# Patient Record
Sex: Female | Born: 1970 | Race: White | Hispanic: No | Marital: Married | Smoking: Former smoker
Health system: Southern US, Community
[De-identification: ages and names within clinical notes are randomized; demographics above are authoritative.]

## PROBLEM LIST (undated history)

## (undated) DIAGNOSIS — E7800 Pure hypercholesterolemia, unspecified: Secondary | ICD-10-CM

## (undated) DIAGNOSIS — N809 Endometriosis, unspecified: Secondary | ICD-10-CM

## (undated) DIAGNOSIS — G43909 Migraine, unspecified, not intractable, without status migrainosus: Secondary | ICD-10-CM

## (undated) DIAGNOSIS — R569 Unspecified convulsions: Secondary | ICD-10-CM

## (undated) DIAGNOSIS — D219 Benign neoplasm of connective and other soft tissue, unspecified: Secondary | ICD-10-CM

## (undated) DIAGNOSIS — F17200 Nicotine dependence, unspecified, uncomplicated: Secondary | ICD-10-CM

## (undated) DIAGNOSIS — F32A Depression, unspecified: Secondary | ICD-10-CM

## (undated) DIAGNOSIS — F329 Major depressive disorder, single episode, unspecified: Secondary | ICD-10-CM

## (undated) HISTORY — DX: Benign neoplasm of connective and other soft tissue, unspecified: D21.9

## (undated) HISTORY — PX: FOOT SURGERY: SHX648

## (undated) HISTORY — DX: Nicotine dependence, unspecified, uncomplicated: F17.200

## (undated) HISTORY — DX: Pure hypercholesterolemia, unspecified: E78.00

## (undated) HISTORY — DX: Endometriosis, unspecified: N80.9

## (undated) HISTORY — DX: Migraine, unspecified, not intractable, without status migrainosus: G43.909

## (undated) HISTORY — PX: TUBAL LIGATION: SHX77

## (undated) HISTORY — DX: Depression, unspecified: F32.A

## (undated) HISTORY — PX: OOPHORECTOMY: SHX86

## (undated) HISTORY — DX: Major depressive disorder, single episode, unspecified: F32.9

---

## 1997-09-25 ENCOUNTER — Other Ambulatory Visit: Admission: RE | Admit: 1997-09-25 | Discharge: 1997-09-25 | Payer: Self-pay | Admitting: Obstetrics & Gynecology

## 1997-11-18 ENCOUNTER — Ambulatory Visit (HOSPITAL_COMMUNITY): Admission: RE | Admit: 1997-11-18 | Discharge: 1997-11-18 | Payer: Self-pay | Admitting: Obstetrics & Gynecology

## 1998-01-07 ENCOUNTER — Other Ambulatory Visit: Admission: RE | Admit: 1998-01-07 | Discharge: 1998-01-07 | Payer: Self-pay | Admitting: *Deleted

## 1998-03-31 ENCOUNTER — Inpatient Hospital Stay (HOSPITAL_COMMUNITY): Admission: AD | Admit: 1998-03-31 | Discharge: 1998-04-03 | Payer: Self-pay | Admitting: Obstetrics and Gynecology

## 2006-06-25 ENCOUNTER — Ambulatory Visit: Payer: Self-pay | Admitting: Family Medicine

## 2006-07-02 ENCOUNTER — Ambulatory Visit: Payer: Self-pay | Admitting: Family Medicine

## 2006-07-11 ENCOUNTER — Ambulatory Visit: Payer: Self-pay | Admitting: Family Medicine

## 2006-08-13 ENCOUNTER — Ambulatory Visit: Payer: Self-pay | Admitting: Family Medicine

## 2006-10-25 ENCOUNTER — Ambulatory Visit: Payer: Self-pay | Admitting: Family Medicine

## 2009-06-17 ENCOUNTER — Ambulatory Visit: Payer: Self-pay | Admitting: Family Medicine

## 2009-07-22 ENCOUNTER — Ambulatory Visit: Payer: Self-pay | Admitting: Family Medicine

## 2010-07-30 ENCOUNTER — Encounter: Payer: Self-pay | Admitting: Family Medicine

## 2011-09-07 ENCOUNTER — Encounter: Payer: Self-pay | Admitting: Family Medicine

## 2011-09-07 ENCOUNTER — Ambulatory Visit (INDEPENDENT_AMBULATORY_CARE_PROVIDER_SITE_OTHER): Payer: 59 | Admitting: Family Medicine

## 2011-09-07 ENCOUNTER — Encounter: Payer: Self-pay | Admitting: Internal Medicine

## 2011-09-07 VITALS — BP 122/80 | HR 87 | Wt 270.0 lb

## 2011-09-07 DIAGNOSIS — M79672 Pain in left foot: Secondary | ICD-10-CM

## 2011-09-07 DIAGNOSIS — M79609 Pain in unspecified limb: Secondary | ICD-10-CM

## 2011-09-07 DIAGNOSIS — N920 Excessive and frequent menstruation with regular cycle: Secondary | ICD-10-CM

## 2011-09-07 LAB — CBC WITH DIFFERENTIAL/PLATELET
Basophils Absolute: 0 10*3/uL (ref 0.0–0.1)
Basophils Relative: 0 % (ref 0–1)
Eosinophils Absolute: 0.2 10*3/uL (ref 0.0–0.7)
Eosinophils Relative: 3 % (ref 0–5)
HCT: 38.9 % (ref 36.0–46.0)
Hemoglobin: 13.3 g/dL (ref 12.0–15.0)
Lymphocytes Relative: 32 % (ref 12–46)
Lymphs Abs: 2.9 10*3/uL (ref 0.7–4.0)
MCH: 30.9 pg (ref 26.0–34.0)
MCHC: 34.2 g/dL (ref 30.0–36.0)
MCV: 90.3 fL (ref 78.0–100.0)
Monocytes Absolute: 0.5 10*3/uL (ref 0.1–1.0)
Monocytes Relative: 5 % (ref 3–12)
Neutro Abs: 5.3 10*3/uL (ref 1.7–7.7)
Neutrophils Relative %: 59 % (ref 43–77)
Platelets: 352 10*3/uL (ref 150–400)
RBC: 4.31 MIL/uL (ref 3.87–5.11)
RDW: 13.1 % (ref 11.5–15.5)
WBC: 8.9 10*3/uL (ref 4.0–10.5)

## 2011-09-07 MED ORDER — MEDROXYPROGESTERONE ACETATE 10 MG PO TABS: 10.0000 mg | ORAL_TABLET | Freq: Every day | ORAL | Status: DC

## 2011-09-07 MED ORDER — TRAMADOL HCL 50 MG PO TABS: 50.0000 mg | ORAL_TABLET | Freq: Three times a day (TID) | ORAL | Status: AC | PRN

## 2011-09-07 NOTE — Patient Instructions (Signed)
Let me know the name of the orthopedic surgeon so we can make the referral.

## 2011-09-07 NOTE — Progress Notes (Signed)
  Subjective:    Patient ID: Jacqueline Davis, female    DOB: 1971/06/30, 41 y.o.   MRN: [id number]  HPI She complains of difficulty with her menstrual cycle. She had the start of a cycle about 2 days earlier than normal which lasted for 2 days. She then went 3 days without any bleeding and started bleeding again for 4 days. I've days later she started again and has now been flowing for 12 days. She has been having increasing difficulty with cramping and with her bleeding. Prior to this her cycles have been regular. She has had a BTL in 1999. She also complains of pain and swelling in her left foot. She has a previous history of a crush injury and subsequent orthopedic followup and a midfoot fusion.   Review of Systems     Objective:   Physical Exam Exam of the left foot does show decreased motion of the ankle and forefoot. She does have some slight swelling over the dorsum of the foot. Pelvic exam shows a small amount of blood. Difficult to do the pelvic due to her abdominal adiposity.       Assessment & Plan:   1. Menorrhagia  CBC with Differential  2. Left foot pain     she does not remember the name of the orthopedic surgeon that she saw. Recommend she get me his name and I will refer her back to him. We'll also give her Provera to help with her bleeding and if no improvement possible referral will be made. Tramadol as written. Cautioned her to use it sparingly.

## 2011-09-08 NOTE — Progress Notes (Signed)
Quick Note:  The blood work is normal ______ 

## 2011-09-12 ENCOUNTER — Telehealth: Payer: Self-pay | Admitting: Family Medicine

## 2011-09-12 NOTE — Telephone Encounter (Signed)
Needs GYN referral for continued difficulty with menorrhagia

## 2011-09-12 NOTE — Telephone Encounter (Signed)
Please call patient. She states you told her that her period may get worse before it gets better. Her question is how much worse?  Bleeding heavier than she was. Yesterday used 8-10 tampons and pads in half of a day.  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

## 2011-09-12 NOTE — Telephone Encounter (Signed)
Needs gyn referral

## 2011-09-14 NOTE — Telephone Encounter (Signed)
Pt will make appt!

## 2011-09-15 ENCOUNTER — Encounter (INDEPENDENT_AMBULATORY_CARE_PROVIDER_SITE_OTHER): Payer: 59 | Admitting: Obstetrics and Gynecology

## 2011-09-15 DIAGNOSIS — N920 Excessive and frequent menstruation with regular cycle: Secondary | ICD-10-CM

## 2011-09-15 DIAGNOSIS — E663 Overweight: Secondary | ICD-10-CM

## 2011-09-28 ENCOUNTER — Telehealth: Payer: Self-pay | Admitting: Family Medicine

## 2011-09-28 NOTE — Telephone Encounter (Signed)
DONE

## 2014-07-10 HISTORY — PX: CHOLECYSTECTOMY, LAPAROSCOPIC: SHX56

## 2015-04-15 ENCOUNTER — Other Ambulatory Visit: Payer: Self-pay

## 2015-04-15 DIAGNOSIS — Z1231 Encounter for screening mammogram for malignant neoplasm of breast: Secondary | ICD-10-CM

## 2015-04-19 ENCOUNTER — Ambulatory Visit
Admission: RE | Admit: 2015-04-19 | Discharge: 2015-04-19 | Disposition: A | Discharge status: Home / Self Care | Payer: 59 | Source: Ambulatory Visit

## 2015-04-19 DIAGNOSIS — Z1231 Encounter for screening mammogram for malignant neoplasm of breast: Secondary | ICD-10-CM

## 2015-07-20 ENCOUNTER — Other Ambulatory Visit: Payer: Self-pay | Admitting: Gynecology

## 2015-07-20 ENCOUNTER — Ambulatory Visit (INDEPENDENT_AMBULATORY_CARE_PROVIDER_SITE_OTHER): Payer: 59 | Admitting: Gynecology

## 2015-07-20 ENCOUNTER — Encounter: Payer: Self-pay | Admitting: Gynecology

## 2015-07-20 ENCOUNTER — Ambulatory Visit (INDEPENDENT_AMBULATORY_CARE_PROVIDER_SITE_OTHER): Payer: 59

## 2015-07-20 VITALS — BP 122/74 | Ht 62.5 in | Wt 209.0 lb

## 2015-07-20 DIAGNOSIS — G44019 Episodic cluster headache, not intractable: Secondary | ICD-10-CM | POA: Diagnosis not present

## 2015-07-20 DIAGNOSIS — N926 Irregular menstruation, unspecified: Secondary | ICD-10-CM

## 2015-07-20 DIAGNOSIS — D251 Intramural leiomyoma of uterus: Secondary | ICD-10-CM

## 2015-07-20 DIAGNOSIS — D259 Leiomyoma of uterus, unspecified: Secondary | ICD-10-CM | POA: Diagnosis not present

## 2015-07-20 DIAGNOSIS — N83201 Unspecified ovarian cyst, right side: Secondary | ICD-10-CM | POA: Diagnosis not present

## 2015-07-20 DIAGNOSIS — Z86718 Personal history of other venous thrombosis and embolism: Secondary | ICD-10-CM

## 2015-07-20 NOTE — Patient Instructions (Addendum)
Uterine Fibroids Uterine fibroids are tissue masses (tumors) that can develop in the womb (uterus). They are also called leiomyomas. This type of tumor is not cancerous (benign) and does not spread to other parts of the body outside of the pelvic area, which is between the hip bones. Occasionally, fibroids may develop in the fallopian tubes, in the cervix, or on the support structures (ligaments) that surround the uterus. You can have one or many fibroids. Fibroids can vary in size, weight, and where they grow in the uterus. Some can become quite large. Most fibroids do not require medical treatment. CAUSES A fibroid can develop when a single uterine cell keeps growing (replicating). Most cells in the human body have a control mechanism that keeps them from replicating without control. SIGNS AND SYMPTOMS Symptoms may include:   Heavy bleeding during your period.  Bleeding or spotting between periods.  Pelvic pain and pressure.  Bladder problems, such as needing to urinate more often (urinary frequency) or urgently.  Inability to reproduce offspring (infertility).  Miscarriages. DIAGNOSIS Uterine fibroids are diagnosed through a physical exam. Your health care provider may feel the lumpy tumors during a pelvic exam. Ultrasonography and an MRI may be done to determine the size, location, and number of fibroids. TREATMENT Treatment may include:  Watchful waiting. This involves getting the fibroid checked by your health care provider to see if it grows or shrinks. Follow your health care provider's recommendations for how often to have this checked.  Hormone medicines. These can be taken by mouth or given through an intrauterine device (IUD).  Surgery.  Removing the fibroids (myomectomy) or the uterus (hysterectomy).  Removing blood supply to the fibroids (uterine artery embolization). If fibroids interfere with your fertility and you want to become pregnant, your health care provider  may recommend having the fibroids removed.  HOME CARE INSTRUCTIONS  Keep all follow-up visits as directed by your health care provider. This is important.  Take medicines only as directed by your health care provider.  If you were prescribed a hormone treatment, take the hormone medicines exactly as directed.  Do not take aspirin, because it can cause bleeding.  Ask your health care provider about taking iron pills and increasing the amount of dark green, leafy vegetables in your diet. These actions can help to boost your blood iron levels, which may be affected by heavy menstrual bleeding.  Pay close attention to your period and tell your health care provider about any changes, such as:  Increased blood flow that requires you to use more pads or tampons than usual per month.  A change in the number of days that your period lasts per month.  A change in symptoms that are associated with your period, such as abdominal cramping or back pain. SEEK MEDICAL CARE IF:  You have pelvic pain, back pain, or abdominal cramps that cannot be controlled with medicines.  You have an increase in bleeding between and during periods.  You soak tampons or pads in a half hour or less.  You feel lightheaded, extra tired, or weak. SEEK IMMEDIATE MEDICAL CARE IF:  You faint.  You have a sudden increase in pelvic pain.   This information is not intended to replace advice given to you by your health care provider. Make sure you discuss any questions you have with your health care provider.   Document Released: 06/23/2000 Document Revised: 07/17/2014 Document Reviewed: 12/23/2013 Elsevier Interactive Patient Education 2016 [employer]  Total Laparoscopic Hysterectomy A total laparoscopic  hysterectomy is a minimally invasive surgery to remove your uterus and cervix. This surgery is performed by making several small cuts (incisions) in your abdomen. It can also be done with a thin, lighted tube  (laparoscope) inserted into two small incisions in your lower abdomen. Your fallopian tubes and ovaries can be removed (bilateral salpingo-oophorectomy) during this surgery as well.Benefits of minimally invasive surgery include:  Less pain.  Less risk of blood loss.  Less risk of infection.  Quicker return to normal activities. LET [facility] CARE PROVIDER KNOW ABOUT:  Any allergies you have.  All medicines you are taking, including vitamins, herbs, eye drops, creams, and over-the-counter medicines.  Previous problems you or members of your family have had with the use of anesthetics.  Any blood disorders you have.  Previous surgeries you have had.  Medical conditions you have. RISKS AND COMPLICATIONS  Generally, this is a safe procedure. However, as with any procedure, complications can occur. Possible complications include:  Bleeding.  Blood clots in the legs or lung.  Infection.  Injury to surrounding organs.  Problems with anesthesia.  Early menopause symptoms (hot flashes, night sweats, insomnia).  Risk of conversion to an open abdominal incision. BEFORE THE PROCEDURE  Ask your health care provider about changing or stopping your regular medicines.  Do not take aspirin or blood thinners (anticoagulants) for 1 week before the surgery or as told by your health care provider.  Do not eat or drink anything for 8 hours before the surgery or as told by your health care provider.  Quit smoking if you smoke.  Arrange for a ride home after surgery and for someone to help you at home during recovery. PROCEDURE   You will be given antibiotic medicine.  An IV tube will be placed in your arm. You will be given medicine to make you sleep (general anesthetic).  A gas (carbon dioxide) will be used to inflate your abdomen. This will allow your surgeon to look inside your abdomen, perform your surgery, and treat any other problems found if necessary.  Three or four small  incisions (often less than 1/2 inch) will be made in your abdomen. One of these incisions will be made in the area of your belly button (navel). The laparoscope will be inserted into the incision. Your surgeon will look through the laparoscope while doing your procedure.  Other surgical instruments will be inserted through the other incisions.  Your uterus may be removed through your vagina or cut into small pieces and removed through the small incisions.  Your incisions will be closed. AFTER THE PROCEDURE  The gas will be released from inside your abdomen.  You will be taken to the recovery area where a nurse will watch and check your progress. Once you are awake, stable, and taking fluids well, without other problems, you will return to your room or be allowed to go home.  There is usually minimal discomfort following the surgery because the incisions are so small.  You will be given pain medicine while you are in the hospital and for when you go home.   This information is not intended to replace advice given to you by your health care provider. Make sure you discuss any questions you have with your health care provider.   Document Released: 04/23/2007 Document Revised: 02/26/2013 Document Reviewed: 01/14/2013 Elsevier Interactive Patient Education [employer]. Endometriosis Endometriosis is a condition in which the tissue that lines the uterus (endometrium) grows outside of its normal location.  The tissue may grow in many locations close to the uterus, but it commonly grows on the ovaries, fallopian tubes, vagina, or bowel. Because the uterus expels, or sheds, its lining every menstrual cycle, there is bleeding wherever the endometrial tissue is located. This can cause pain because blood is irritating to tissues not normally exposed to it.  CAUSES  The cause of endometriosis is not known.  SIGNS AND SYMPTOMS  Often, there are no symptoms. When symptoms are present, they can vary  with the location of the displaced tissue. Various symptoms can occur at different times. Although symptoms occur mainly during a woman's menstrual period, they can also occur midcycle and usually stop with menopause. Some people may go months with no symptoms at all. Symptoms may include:   Back or abdominal pain.   Heavier bleeding during periods.   Pain during intercourse.   Painful bowel movements.   Infertility. DIAGNOSIS  Your health care provider will do a physical exam and ask about your symptoms. Various tests may be done, such as:   Blood tests and urine tests. These are done to help rule out other problems.   Ultrasound. This test is done to look for abnormal tissue.   An X-ray of the lower bowel (barium enema).  Laparoscopy. In this procedure, a thin, lighted tube with a tiny camera on the end (laparoscope) is inserted into your abdomen. This helps your health care provider look for abnormal tissue to confirm the diagnosis. The health care provider may also remove a small piece of tissue (biopsy) from any abnormal tissue found. This tissue sample can then be sent to a lab so it can be looked at under a microscope. TREATMENT  Treatment will vary and may include:   Medicines to relieve pain. Nonsteroidal anti-inflammatory drugs (NSAIDs) are a type of pain medicine that can help to relieve the pain caused by endometriosis.  Hormonal therapy. When using hormonal therapy, periods are eliminated. This eliminates the monthly exposure to blood by the displaced endometrial tissue.   Surgery. Surgery may sometimes be done to remove the abnormal endometrial tissue. In severe cases, surgery may be done to remove the fallopian tubes, uterus, and ovaries (hysterectomy). HOME CARE INSTRUCTIONS   Take all medicines as directed by your health care provider. Do not take aspirin because it may increase bleeding when you are not on hormonal therapy.   Avoid activities that produce  pain, including sexual activity. SEEK MEDICAL CARE IF:  You have pelvic pain before, after, or during your periods.  You have pelvic pain between periods that gets worse during your period.  You have pelvic pain during or after sex.  You have pelvic pain with bowel movements or urination, especially during your period.  You have problems getting pregnant.  You have a fever. SEEK IMMEDIATE MEDICAL CARE IF:   Your pain is severe and is not responding to pain medicine.   You have severe nausea and vomiting, or you cannot keep foods down.   You have pain that is limited to the right lower part of your abdomen.   You have swelling or increasing pain in your abdomen.   You see blood in your stool.  MAKE SURE YOU:   Understand these instructions.  Will watch your condition.  Will get help right away if you are not doing well or get worse.   This information is not intended to replace advice given to you by your health care provider. Make sure you discuss any  questions you have with your health care provider.   Document Released: 06/23/2000 Document Revised: 07/17/2014 Document Reviewed: 02/21/2013 Elsevier Interactive Patient Education [employer].

## 2015-07-20 NOTE — Progress Notes (Signed)
   Patient is a 45 year old gravida 2 para 2 (prior cesarean section/tubal ligation) who presented to the office today referred by her PCP Bronwen Betters as a result of a fibroid uterus and noted at time of evaluation last year yes CT scan as a result of patient's abdominal pains which turned out to be cholelithiasis. She stated that she had her cholecystectomy in November 2017 and is here for follow-up as a result of the incidental finding of the fibroid on CT scan. She reports menstrual cycles the last 5-6 days but sometimes they occur every 6-8 weeks. She denies any nipple discharge some occasional visual disturbances and recently some headaches. Her PCP has done her blood work recently to include CBC and TSH a few weeks ago to which patient stated was normal.  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 She is sexually active is not complaining of any dyspareunia. 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 The first episode was postop after for surgery in 2006 and then once again in 2012. She is currently on 81 mg of baby aspirin daily. She denies any easy bruisability.  CT scan done November 19 had demonstrated evidence of multiple sub-Center gallstones within the gallbladder and also a 4.3 cm posterior upper uterine body mass likely representing a fibroid. No adnexal masses were reported the rest of the CT scan was otherwise normal.  Exam: Gen. appearance: Well developed well nourished female with the above mentioned findings on CT scan but no acute distress Abdomen: Soft nontender no rebound or guarding Pelvic: Bartholin urethra Skene glands within normal limits Vagina: Slight pinkish discharge patient currently on telemetry no menstrual cycle Cervix: No lesions or discharge Uterus: Upper limits of normal 5 irregularity at the fundus of the uterus posterior questionable fibroid Adnexa: No palpable masses or tenderness Rectal exam:  Not done  Ultrasound today: Uterus measured 8.9 x 7.1 x 6.4 cm with endometrial stripe of 1.5 mm. Uterus was posterior fundal intramural fibroid measured 5.8 x 3.6 x 3.7 cm. Thin echogenic endometrium. Right ovary thick wall corpus luteum cyst measuring 22 x 11 x 16 mm with positive color flow the periphery. Left ovary not seen. Negative left adnexa no apparent adnexal masses. Some fluid was noted the cul-de-sac 27 x 7 mm.  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 Patient cholecystectomy last year. Patient now is brought to my attention that time she has dyspareunia. Especially during deep thrust. Because of her irregular menstrual cycle history we are going to check a prolactin level as well as an [place]. Her PCP did her TSH and the rest of her blood work recently. Patient with past history of DVT 2 on baby aspirin. Patient's sister and daughter have had endometriosis. I'm going to provide her with literature and information on endometriosis as well as on fibroid uterus. She will maintain a menstrual calendar for the next 6 months. She'll return back to the office 66 months for follow-up ultrasound at that time we'll reassess in review her symptoms as well. If it is affecting her quality of life and we discussed and at that time hysterectomy.

## 2015-07-21 LAB — PROLACTIN: Prolactin: 6.3 ng/mL

## 2015-07-21 LAB — FOLLICLE STIMULATING HORMONE: FSH: 47.8 m[IU]/mL

## 2015-08-17 ENCOUNTER — Ambulatory Visit (INDEPENDENT_AMBULATORY_CARE_PROVIDER_SITE_OTHER): Payer: 59 | Admitting: Neurology

## 2015-08-17 ENCOUNTER — Encounter: Payer: Self-pay | Admitting: Neurology

## 2015-08-17 VITALS — BP 145/95 | HR 63 | Ht 62.5 in | Wt 213.8 lb

## 2015-08-17 DIAGNOSIS — R2689 Other abnormalities of gait and mobility: Secondary | ICD-10-CM

## 2015-08-17 DIAGNOSIS — R251 Tremor, unspecified: Secondary | ICD-10-CM | POA: Diagnosis not present

## 2015-08-17 DIAGNOSIS — G43909 Migraine, unspecified, not intractable, without status migrainosus: Secondary | ICD-10-CM | POA: Insufficient documentation

## 2015-08-17 DIAGNOSIS — H532 Diplopia: Secondary | ICD-10-CM | POA: Diagnosis not present

## 2015-08-17 DIAGNOSIS — R269 Unspecified abnormalities of gait and mobility: Secondary | ICD-10-CM | POA: Diagnosis not present

## 2015-08-17 NOTE — Progress Notes (Signed)
[facility]    Provider:  Dr Jaynee Eagles Referring Provider: Delia Chimes, NP Primary Care Physician:  Delia Chimes, NP  CC:  tremor  HPI:  Jacqueline Davis is a 45 y.o. female here as a referral from Dr. Toy Cookey for tremor. PMHx hld, migraines, depression, anxiety, obesity. She noticed tremor last summer. The tremor is off and on. More the right arm. She is right handed. She has some blurred vision at baseline but had some episodes of double vision (one on top of the other and slightly skewed), she has some visual acuity difficulty and needs prisms in her lenses. She has had 20 minutes x 3 episodes of diplopia that happened since last summer. It has happened in the morning, not associated with time of day or with fatigue. The tremor is when she is just sitting there the thumb does it a lot harder to thread needles for example. Not severe, not affecting lfe significantly, its annoying. It happens daily, variable amounts of time, can be action or rest, increased with anxiety her whole body shakes. She does not drink caffeine, decaf and half caf only. She started taking prozac last September and hasn't affected the tremor.  No tremor in the family as far as she knows. She is off balance and falls sometimes. She holds the railing, she is off balance, not dizzy. She fell backwards in the dining room as she was stepping to close the door. She takes Klonopin and husband thinks it was that. Shee denies dizziness. She has fallen on the ground 6 times. She has other complints such as memory loss, neck pain, joint pain and stress at home, LBP, neck pain, left hip and foot injuries.  Reviewed notes, labs and imaging from outside physicians, which showed:  Reviewed notes from [place] [facility]. Patient reporting anorexia, ahedonia , nausea vomiting with most foods, admits to being mostly down and sad but occasionally has lots of energy without increased spending but is very talkative and  active and decreased sleep. She reports her family does not know what to do with her when she becomes like this which is not very often. Exam was normal. Mental status exam was normal and patient's mood and affect were described as normal. She was diagnosed with bipolar depression, anhedonia and started on Seroquel.  Review of Systems: Patient complains of symptoms per HPI as well as the following symptoms: blurred vision, fatigue, double vision, loss of vision, eye pain, easy bruising, feeling cold, hearing loss, joint paoin, aching muscles, memory loss, headche, weakness, difficulty swallowing, dizziness, tremor, depression, anxiety, not enough sleep, dec energy. Pertinent negatives per HPI. All others negative.   Social History   Social History  . Marital Status: Married    Spouse Name: Jeral Fruit  . Number of Children: 2  . Years of Education: 14   Occupational History  . [employer]    Social History Main Topics  . Smoking status: Former Smoker -- 1.00 packs/day    Types: Cigarettes    Quit date: 07/19/2009  . Smokeless tobacco: Never Used  . Alcohol Use: 0.0 oz/week    0 Standard drinks or equivalent per week     Comment: OCC  . Drug Use: No  . Sexual Activity: Yes     Comment: TUBAL LIGATION    Other Topics Concern  . Not on file   Social History Narrative   Lives with husband and daughter   Caffeine use: 1/2-1 cup coffee per day    Family  History  Problem Relation Age of Onset  . Diabetes Mother   . Hypertension Mother   . Fibromyalgia Mother   . Cancer Sister     UTERINE  . Diabetes Paternal Grandfather   . Endometriosis Daughter   . Endometriosis Sister   . Neuropathy Neg Hx     Past Medical History  Diagnosis Date  . Smoker   . High cholesterol   . Depression   . Migraine   . Endometriosis   . Fibroid tumor     Uterus    Past Surgical History  Procedure Laterality Date  . Cholecystectomy, laparoscopic  2016  . Cesarean section  1990/1999     X2  . Oophorectomy Right   . Tubal ligation    . Foot surgery  2005/2006    X2    Current Outpatient Prescriptions  Medication Sig Dispense Refill  . aspirin EC 81 MG tablet Take 81 mg by mouth daily.    Marland Kitchen atenolol (TENORMIN) 25 MG tablet Take by mouth daily.    Marland Kitchen atorvastatin (LIPITOR) 20 MG tablet Take 20 mg by mouth daily.    . calcium-vitamin D 250-100 MG-UNIT tablet Take 1 tablet by mouth 2 (two) times daily.    . cholecalciferol (VITAMIN D) 1000 units tablet Take 1,000 Units by mouth daily.    . clonazePAM (KLONOPIN) 0.5 MG tablet Take 0.5 mg by mouth 2 (two) times daily as needed for anxiety.    Marland Kitchen FLUoxetine (PROZAC) 40 MG capsule Take 40 mg by mouth daily.    . fluticasone (FLONASE) 50 MCG/ACT nasal spray Place into both nostrils daily.    Marland Kitchen gabapentin (NEURONTIN) 100 MG capsule Take 100 mg by mouth 3 (three) times daily.    . methocarbamol (ROBAXIN) 500 MG tablet Take 500 mg by mouth 4 (four) times daily.    . Multiple Vitamin (MULTIVITAMIN) tablet Take 1 tablet by mouth daily.    Marland Kitchen omeprazole (PRILOSEC) 20 MG capsule Take 20 mg by mouth daily.    . QUEtiapine (SEROQUEL) 100 MG tablet Take 100 mg by mouth at bedtime.    . ranitidine (ZANTAC) 150 MG tablet Take 150 mg by mouth 2 (two) times daily.    . medroxyPROGESTERone (PROVERA) 10 MG tablet Take 1 tablet (10 mg total) by mouth daily. 7 tablet 0   No current facility-administered medications for this visit.    Allergies as of 08/17/2015  . (No Known Allergies)    Vitals: BP 145/95 mmHg  Pulse 63  Ht 5' 2.5" (1.588 m)  Wt 213 lb 12.8 oz (96.979 kg)  BMI 38.46 kg/m2  SpO2 97%  LMP 07/09/2015 Last Weight:  Wt Readings from Last 1 Encounters:  08/17/15 213 lb 12.8 oz (96.979 kg)   Last Height:   Ht Readings from Last 1 Encounters:  08/17/15 5' 2.5" (1.588 m)   Physical exam: Exam: Gen: NAD, conversant, well nourised, obese, well groomed                     CV: RRR, no MRG. No Carotid Bruits. No peripheral  edema, warm, nontender Eyes: Conjunctivae clear without exudates or hemorrhage  Neuro: Detailed Neurologic Exam  Speech:    Speech is normal; fluent and spontaneous with normal comprehension.  Cognition:    The patient is oriented to person, place, and time;     recent and remote memory intact;     language fluent;     normal attention, concentration,  fund of knowledge Cranial Nerves:    The pupils are equal, round, and reactive to light. The fundi are without edema. Visual fields are full to finger confrontation. Extraocular movements are intact. Trigeminal sensation is intact and the muscles of mastication are normal. The face is symmetric. The palate elevates in the midline. Hearing intact. Voice is normal. Shoulder shrug is normal. The tongue has normal motion without fasciculations.   Coordination:    Normal finger to nose and heel to shin.  Gait:    Not ataxic  Motor Observation:    Very mild high-frequency low amplitude postural tremor. Tone:    Normal muscle tone.    Posture:    Posture is normal. normal erect    Strength: left df weakness due to previous injury, mild hip flexion weakness    Otherwise trength is V/V in the upper and lower limbs.      Sensation: intact to LT, pp, temp, prop and vibration     Reflex Exam:  DTR's:    Deep tendon reflexes in the upper and lower extremities are normal bilaterally.   Toes:    The toes are equiv bilaterally.   Clonus:    Clonus is absent.       Assessment/Plan:  This is a 45 year old female with a past medical history of bipolar depression, left hip and foot injuries here for evaluation of tremor, falls, and other multiple complaints such as 3 episodes brief diplopia. Neuro exam unremarkable. May be essential vs physiologic tremor.   Tremor, falls, diplopia: Will order Mri brain and cervical cord Falls - likely multifactorial, previous hip and foot injuries with weakness, needs physical therapy for gait and  safety assessment and use of possible walking aids. MRI brain and cervical cord. Recommend minimizing all meds as much as possible that can cause sedation and risk of falls.  Labs today including tsh, bmp, heavy metals  Cc: susan fuller  Sarina Ill, MD  [facility] [address] [place], [place] [address]  Phone [phone] Fax [phone]

## 2015-08-17 NOTE — Patient Instructions (Addendum)
Remember to drink plenty of fluid, eat healthy meals and do not skip any meals. Try to eat protein with a every meal and eat a healthy snack such as fruit or nuts in between meals. Try to keep a regular sleep-wake schedule and try to exercise daily, particularly in the form of walking, 20-30 minutes a day, if you can.   As far as diagnostic testing: MRI of the brain and cervical cord, labs, physical therapy  I would like to see you back in 4 months, sooner if we need to. Please call us with any interim questions, concerns, problems, updates or refill requests.   Our phone number is [phone]. We also have an after hours call service for urgent matters and there is a physician on-call for urgent questions. For any emergencies you know to call 911 or go to the nearest emergency room

## 2015-08-19 ENCOUNTER — Telehealth: Payer: Self-pay | Admitting: *Deleted

## 2015-08-19 LAB — BASIC METABOLIC PANEL
BUN/Creatinine Ratio: 10 (ref 9–23)
BUN: 6 mg/dL (ref 6–24)
CHLORIDE: 105 mmol/L (ref 96–106)
CO2: 21 mmol/L (ref 18–29)
Calcium: 8.8 mg/dL (ref 8.7–10.2)
Creatinine, Ser: 0.58 mg/dL (ref 0.57–1.00)
GFR calc Af Amer: 130 mL/min/{1.73_m2} (ref >59)
GFR calc non Af Amer: 112 mL/min/{1.73_m2} (ref >59)
Glucose: 90 mg/dL (ref 65–99)
POTASSIUM: 4.4 mmol/L (ref 3.5–5.2)
SODIUM: 141 mmol/L (ref 134–144)

## 2015-08-19 LAB — THYROID PANEL WITH TSH
FREE THYROXINE INDEX: 1.8 (ref 1.2–4.9)
T3 Uptake Ratio: 25 % (ref 24–39)
T4 TOTAL: 7.1 ug/dL (ref 4.5–12.0)
TSH: 2.1 u[IU]/mL (ref 0.450–4.500)

## 2015-08-19 LAB — HEAVY METALS, BLOOD
Arsenic: 6 ug/L (ref 2–23)
Lead, Blood: NOT DETECTED ug/dL (ref 0–19)
Mercury: NOT DETECTED ug/L (ref 0.0–14.9)

## 2015-08-19 NOTE — Telephone Encounter (Signed)
Called and spoke to pt about normal labs per Dr Ahern. Pt verbalized understanding.  

## 2015-08-19 NOTE — Telephone Encounter (Signed)
-----   Message from Melvenia Beam, MD sent at 08/19/2015  7:27 AM EST ----- Labs normal thanks

## 2015-08-22 ENCOUNTER — Encounter: Payer: Self-pay | Admitting: Neurology

## 2015-09-08 ENCOUNTER — Ambulatory Visit (INDEPENDENT_AMBULATORY_CARE_PROVIDER_SITE_OTHER): Payer: 59

## 2015-09-08 DIAGNOSIS — R251 Tremor, unspecified: Secondary | ICD-10-CM | POA: Diagnosis not present

## 2015-09-08 DIAGNOSIS — R2689 Other abnormalities of gait and mobility: Secondary | ICD-10-CM | POA: Diagnosis not present

## 2015-09-08 DIAGNOSIS — H532 Diplopia: Secondary | ICD-10-CM

## 2015-09-08 MED ORDER — GADOPENTETATE DIMEGLUMINE 469.01 MG/ML IV SOLN: 20.0000 mL | Freq: Once | INTRAVENOUS | Status: AC | PRN

## 2015-09-10 ENCOUNTER — Telehealth: Payer: Self-pay | Admitting: Neurology

## 2015-09-10 NOTE — Telephone Encounter (Signed)
Jacqueline Davis, would you call patient and ask her to make an appointment to review the MRI of the brain and cervical cord please? Please reassure her there is no cause for alarm, nothing acute or serious seen but it is hard to talk about everything over the phone and I like to show people pictures. thanks

## 2015-09-13 NOTE — Telephone Encounter (Signed)
Called pt and relayed message. Gave [facility] phone number to call back and schedule f/u with Dr Jaynee Eagles. PLEASE schedule if she calls back.

## 2015-09-13 NOTE — Telephone Encounter (Signed)
Pt returned call. Appt made for March 23rd

## 2015-09-13 NOTE — Telephone Encounter (Signed)
FYI

## 2015-09-14 NOTE — Telephone Encounter (Signed)
Called and spoke to pt. Advised up to her on whether she would like her husband to come or not. Relayed Dr Jaynee Eagles message again. Told her to check in 15 min prior to appt time. She verbalized understanding.

## 2015-09-14 NOTE — Telephone Encounter (Signed)
Patient called to inquire if husband needs to be with her at appointment on 09/30/15 to discuss MRI results.

## 2015-09-30 ENCOUNTER — Ambulatory Visit (INDEPENDENT_AMBULATORY_CARE_PROVIDER_SITE_OTHER): Payer: 59 | Admitting: Neurology

## 2015-09-30 ENCOUNTER — Encounter: Payer: Self-pay | Admitting: Neurology

## 2015-09-30 VITALS — BP 111/69 | HR 67 | Ht 62.5 in | Wt 225.0 lb

## 2015-09-30 DIAGNOSIS — R251 Tremor, unspecified: Secondary | ICD-10-CM

## 2015-09-30 DIAGNOSIS — R29898 Other symptoms and signs involving the musculoskeletal system: Secondary | ICD-10-CM

## 2015-09-30 DIAGNOSIS — W19XXXD Unspecified fall, subsequent encounter: Secondary | ICD-10-CM

## 2015-09-30 DIAGNOSIS — H532 Diplopia: Secondary | ICD-10-CM | POA: Diagnosis not present
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
## 2015-10-02 DIAGNOSIS — R29898 Other symptoms and signs involving the musculoskeletal system: Secondary | ICD-10-CM | POA: Insufficient documentation

## 2016-01-17 ENCOUNTER — Encounter: Payer: Self-pay | Admitting: Gynecology

## 2016-01-17 ENCOUNTER — Ambulatory Visit (INDEPENDENT_AMBULATORY_CARE_PROVIDER_SITE_OTHER): Payer: 59

## 2016-01-17 ENCOUNTER — Ambulatory Visit (INDEPENDENT_AMBULATORY_CARE_PROVIDER_SITE_OTHER): Payer: 59 | Admitting: Gynecology

## 2016-01-17 VITALS — BP 122/86

## 2016-01-17 DIAGNOSIS — D251 Intramural leiomyoma of uterus: Secondary | ICD-10-CM | POA: Diagnosis not present

## 2016-01-17 DIAGNOSIS — N946 Dysmenorrhea, unspecified: Secondary | ICD-10-CM

## 2016-01-17 DIAGNOSIS — D259 Leiomyoma of uterus, unspecified: Secondary | ICD-10-CM | POA: Diagnosis not present

## 2016-01-17 DIAGNOSIS — Z86718 Personal history of other venous thrombosis and embolism: Secondary | ICD-10-CM

## 2016-01-17 DIAGNOSIS — N920 Excessive and frequent menstruation with regular cycle: Secondary | ICD-10-CM

## 2016-01-17 NOTE — Patient Instructions (Signed)
Levonorgestrel intrauterine device (IUD) What is this medicine? LEVONORGESTREL IUD (LEE voe nor jes trel) is a contraceptive (birth control) device. The device is placed inside the uterus by a healthcare professional. It is used to prevent pregnancy and can also be used to treat heavy bleeding that occurs during your period. Depending on the device, it can be used for 3 to 5 years. This medicine may be used for other purposes; ask your health care provider or pharmacist if you have questions. What should I tell my health care provider before I take this medicine? They need to know if you have any of these conditions: -abnormal Pap smear -cancer of the breast, uterus, or cervix -diabetes -endometritis -genital or pelvic infection now or in the past -have more than one sexual partner or your partner has more than one partner -heart disease -history of an ectopic or tubal pregnancy -immune system problems -IUD in place -liver disease or tumor -problems with blood clots or take blood-thinners -use intravenous drugs -uterus of unusual shape -vaginal bleeding that has not been explained -an unusual or allergic reaction to levonorgestrel, other hormones, silicone, or polyethylene, medicines, foods, dyes, or preservatives -pregnant or trying to get pregnant -breast-feeding How should I use this medicine? This device is placed inside the uterus by a health care professional. Talk to your pediatrician regarding the use of this medicine in children. Special care may be needed. Overdosage: If you think you have taken too much of this medicine contact a poison control center or emergency room at once. NOTE: This medicine is only for you. Do not share this medicine with others. What if I miss a dose? This does not apply. What may interact with this medicine? Do not take this medicine with any of the following medications: -amprenavir -bosentan -fosamprenavir This medicine may also interact with  the following medications: -aprepitant -barbiturate medicines for inducing sleep or treating seizures -bexarotene -griseofulvin -medicines to treat seizures like carbamazepine, ethotoin, felbamate, oxcarbazepine, phenytoin, topiramate -modafinil -pioglitazone -rifabutin -rifampin -rifapentine -some medicines to treat HIV infection like atazanavir, indinavir, lopinavir, nelfinavir, tipranavir, ritonavir -St. John's wort -warfarin This list may not describe all possible interactions. Give your health care provider a list of all the medicines, herbs, non-prescription drugs, or dietary supplements you use. Also tell them if you smoke, drink alcohol, or use illegal drugs. Some items may interact with your medicine. What should I watch for while using this medicine? Visit your doctor or health care professional for regular check ups. See your doctor if you or your partner has sexual contact with others, becomes HIV positive, or gets a sexual transmitted disease. This product does not protect you against HIV infection (AIDS) or other sexually transmitted diseases. You can check the placement of the IUD yourself by reaching up to the top of your vagina with clean fingers to feel the threads. Do not pull on the threads. It is a good habit to check placement after each menstrual period. Call your doctor right away if you feel more of the IUD than just the threads or if you cannot feel the threads at all. The IUD may come out by itself. You may become pregnant if the device comes out. If you notice that the IUD has come out use a backup birth control method like condoms and call your health care provider. Using tampons will not change the position of the IUD and are okay to use during your period. What side effects may I notice from receiving this medicine?   Side effects that you should report to your doctor or health care professional as soon as possible: -allergic reactions like skin rash, itching or  hives, swelling of the face, lips, or tongue -fever, flu-like symptoms -genital sores -high blood pressure -no menstrual period for 6 weeks during use -pain, swelling, warmth in the leg -pelvic pain or tenderness -severe or sudden headache -signs of pregnancy -stomach cramping -sudden shortness of breath -trouble with balance, talking, or walking -unusual vaginal bleeding, discharge -yellowing of the eyes or skin Side effects that usually do not require medical attention (report to your doctor or health care professional if they continue or are bothersome): -acne -breast pain -change in sex drive or performance -changes in weight -cramping, dizziness, or faintness while the device is being inserted -headache -irregular menstrual bleeding within first 3 to 6 months of use -nausea This list may not describe all possible side effects. Call your doctor for medical advice about side effects. You may report side effects to FDA at 1-800-FDA-1088. Where should I keep my medicine? This does not apply. NOTE: This sheet is a summary. It may not cover all possible information. If you have questions about this medicine, talk to your doctor, pharmacist, or health care provider.    2016, Elsevier/Gold Standard. (2011-07-27 13:54:04)  

## 2016-01-17 NOTE — Progress Notes (Signed)
   Patient is a 45 year old was seen in the office on January 10 as a referral from her PCP [facility] 4 as a result of a fibroid uterus. Patient last year was being evaluated for abdominal pain and it turned out she had cholelithiasis and had a cholecystectomy in November 2017. During that time a CT scan had demonstrated incidentally she had a fibroid measuring 4.3 cm posterior uterine fundus. No adnexal masses were reported.  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 She is sexually active is not complaining of any dyspareunia. 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 The first episode was postop after for surgery in 2006 and then once again in 2012. She is currently on 81 mg of baby aspirin daily. She denies any easy bruisability. She has informed me that her father had a PE and her brother had DVT also. She has not seen a hematologist or has had any further workup.  We did an ultrasound here in the office in January of this year and the following was reported: Uterus measured 8.9 x 7.1 x 6.4 cm with endometrial stripe of 1.5 mm. Uterus was posterior fundal intramural fibroid measured 5.8 x 3.6 x 3.7 cm. Thin echogenic endometrium. Right ovary thick wall corpus luteum cyst measuring 22 x 11 x 16 mm with positive color flow the periphery. Left ovary not seen. Negative left adnexa no apparent adnexal masses. Some fluid was noted the cul-de-sac 27 x 7 mm.  Her follow-up ultrasound today: Uterus measured 9.2 x 8.7 x 5.87 m with endometrial stripe of 3.5 mm. Intramural fibroid measuring 53 x 43 x 62 mm a small right corpus luteum cyst with positive color flow to the periphery measuring 12 x 9 mm. Absent left adnexa. No fluid in the cul-de-sac.  During that office visit she had an [facility] as well as a prolactin level. [place] slightly in the menopausal range prolactin was normal and PCP and recently done a TSH.    Assessment/plan: Stable fibroid  uterus. Patient with 2 previous DVTs currently on baby aspirin. I've recommended for her dysmenorrhea and menorrhagia to consider Mirena IUD. The risks benefits and pros and cons were discussed. She fully understands that all forms of hormone replacement therapy including IUDs were placed on the same category with black box warning a potential risk for blood clots. Very rare with a Mirena IUD. I have recommended consideration of this case invasive procedure to control her symptoms rather than subject her major surgery for hysterectomy with a past history of 2 DVTs. Literature information was provided.  Greater than 50% of the time was spent in counseling according care for this patient. Time of consultation 10 minutes

## 2016-01-18 ENCOUNTER — Telehealth: Payer: Self-pay | Admitting: Gynecology

## 2016-01-18 NOTE — Telephone Encounter (Signed)
01/18/16-I LM VM for patient to let her know that her [employer] ins will cover the Mirena for med dx: N92.0 per JF with her $35 copay and that it needs to be inserted while on cycle if she decides to proceed. Per [email or web address]

## 2016-04-04 ENCOUNTER — Ambulatory Visit: Payer: 59 | Admitting: Neurology

## 2016-04-04 ENCOUNTER — Telehealth: Payer: Self-pay | Admitting: *Deleted

## 2016-04-04 NOTE — Telephone Encounter (Signed)
no showed f/u 

## 2016-04-10 ENCOUNTER — Encounter: Payer: Self-pay | Admitting: Neurology

## 2016-04-19 ENCOUNTER — Telehealth: Payer: Self-pay | Admitting: Neurology

## 2016-04-19 ENCOUNTER — Encounter: Payer: Self-pay | Admitting: Gynecology

## 2016-04-19 ENCOUNTER — Ambulatory Visit (INDEPENDENT_AMBULATORY_CARE_PROVIDER_SITE_OTHER): Payer: 59 | Admitting: Gynecology

## 2016-04-19 ENCOUNTER — Telehealth: Payer: Self-pay

## 2016-04-19 VITALS — BP 136/88

## 2016-04-19 DIAGNOSIS — N926 Irregular menstruation, unspecified: Secondary | ICD-10-CM

## 2016-04-19 DIAGNOSIS — D251 Intramural leiomyoma of uterus: Secondary | ICD-10-CM

## 2016-04-19 DIAGNOSIS — R102 Pelvic and perineal pain: Secondary | ICD-10-CM

## 2016-04-19 NOTE — Progress Notes (Signed)
   Patient is a 45 year old gravida 2 para 2 (2 cesarean sections/tubal ligation) was seen in the office in July 10 as a referral from her PCP as a result of her fibroid uterus. Patient was initially going to be scheduled today for placement of Mirena IUD but after evaluating her medical history of 2 DVTs in the past whereby she had been on anticoagulation and currently on baby aspirin and family history of DVT it would be contraindicated with any form of hormone. She does suffer from pelvic pain and dyspareunia and irregular bleeding and we concluded that it would be best thing to proceed with abdominal hysterectomy and bilateral salpingectomy. She did have an ultrasound in the office 01/17/2016 which demonstrated the following:  Uterus measured 9.2 x 8.7 x 5.87 m with endometrial stripe of 3.5 mm. Intramural fibroid measuring 53 x 43 x 62 mm a small right corpus luteum cyst with positive color flow to the periphery measuring 12 x 9 mm. Absent left adnexa. No fluid in the cul-de-sac.  Patient will be scheduled in the next few months for a total abdominal hysterectomy with bilateral salpingectomy as a result of her chronic pelvic pain highly suspicious for endometriosis as well as for her irregular bleeding and fibroid uterus. I will review her case with the hematologist for recommendations of anticoagulation for this patient before during and after her surgery. Literature information was provided on abdominal hysterectomy. We'll see her the week before her surgery for complete exam.

## 2016-04-19 NOTE — Patient Instructions (Signed)
Abdominal Hysterectomy Abdominal hysterectomy is a surgical procedure to remove your womb (uterus). Your uterus is the muscular organ that contains a developing baby. This surgery is done for many reasons. You may need an abdominal hysterectomy if you have cancer, growths (tumors), long-term pain, or bleeding. You may also have this procedure if your uterus has slipped down into your vagina (uterine prolapse). Depending on why you need an abdominal hysterectomy, you may also have other reproductive organs removed. These could include the part of your vagina that connects with your uterus (cervix), the organs that make eggs (ovaries), and the tubes that connect the ovaries to the uterus (fallopian tubes). LET YOUR HEALTH CARE PROVIDER KNOW ABOUT:   Any allergies you have.  All medicines you are taking, including vitamins, herbs, eye drops, creams, and over-the-counter medicines.  Previous problems you or members of your family have had with the use of anesthetics.  Any blood disorders you have.  Previous surgeries you have had.  Medical conditions you have. RISKS AND COMPLICATIONS Generally, this is a safe procedure. However, as with any procedure, problems can occur. Infection is the most common problem after an abdominal hysterectomy. Other possible problems include:  Bleeding.  Formation of blood clots that may break free and travel to your lungs.  Injury to other organs near your uterus.  Nerve injury causing nerve pain.  Decreased interest in sex or pain during sexual intercourse. BEFORE THE PROCEDURE  Abdominal hysterectomy is a major surgical procedure. It can affect the way you feel about yourself. Talk to your health care provider about the physical and emotional changes hysterectomy may cause.  You may need to have blood work and X-rays done before surgery.  Quit smoking if you smoke. Ask your health care provider for help if you are struggling to quit.  Stop taking  medicines that thin your blood as directed by your health care provider.  You may be instructed to take antibiotic medicines or laxatives before surgery.  Do not eat or drink anything for 6-8 hours before surgery.  Take your regular medicines with a small sip of water.  Bathe or shower the night or morning before surgery. PROCEDURE  Abdominal hysterectomy is done in the operating room at the hospital.  In most cases, you will be given a medicine that makes you go to sleep (general anesthetic).  The surgeon will make a cut (incision) through the skin in your lower belly.  The incision may be about 5-7 inches long. It may go side-to-side or up-and-down.  The surgeon will move aside the body tissue that covers your uterus. The surgeon will then carefully take out your uterus along with any of your other reproductive organs that need to be removed.  Bleeding will be controlled with clamps or sutures.  The surgeon will close your incision with sutures or metal clips. AFTER THE PROCEDURE  You will have some pain immediately after the procedure.  You will be given pain medicine in the recovery room.  You will be taken to your hospital room when you have recovered from the anesthesia.  You may need to stay in the hospital for 2-5 days.  You will be given instructions for recovery at home.   This information is not intended to replace advice given to you by your health care provider. Make sure you discuss any questions you have with your health care provider.   Document Released: 07/01/2013 Document Reviewed: 07/01/2013 Elsevier Interactive Patient Education 2016 Elsevier Inc. Uterine Fibroids   Uterine fibroids are tissue masses (tumors) that can develop in the womb (uterus). They are also called leiomyomas. This type of tumor is not cancerous (benign) and does not spread to other parts of the body outside of the pelvic area, which is between the hip bones. Occasionally, fibroids may  develop in the fallopian tubes, in the cervix, or on the support structures (ligaments) that surround the uterus. You can have one or many fibroids. Fibroids can vary in size, weight, and where they grow in the uterus. Some can become quite large. Most fibroids do not require medical treatment. CAUSES A fibroid can develop when a single uterine cell keeps growing (replicating). Most cells in the human body have a control mechanism that keeps them from replicating without control. SIGNS AND SYMPTOMS Symptoms may include:   Heavy bleeding during your period.  Bleeding or spotting between periods.  Pelvic pain and pressure.  Bladder problems, such as needing to urinate more often (urinary frequency) or urgently.  Inability to reproduce offspring (infertility).  Miscarriages. DIAGNOSIS Uterine fibroids are diagnosed through a physical exam. Your health care provider may feel the lumpy tumors during a pelvic exam. Ultrasonography and an MRI may be done to determine the size, location, and number of fibroids. TREATMENT Treatment may include:  Watchful waiting. This involves getting the fibroid checked by your health care provider to see if it grows or shrinks. Follow your health care provider's recommendations for how often to have this checked.  Hormone medicines. These can be taken by mouth or given through an intrauterine device (IUD).  Surgery.  Removing the fibroids (myomectomy) or the uterus (hysterectomy).  Removing blood supply to the fibroids (uterine artery embolization). If fibroids interfere with your fertility and you want to become pregnant, your health care provider may recommend having the fibroids removed.  HOME CARE INSTRUCTIONS  Keep all follow-up visits as directed by your health care provider. This is important.  Take medicines only as directed by your health care provider.  If you were prescribed a hormone treatment, take the hormone medicines exactly as  directed.  Do not take aspirin, because it can cause bleeding.  Ask your health care provider about taking iron pills and increasing the amount of dark green, leafy vegetables in your diet. These actions can help to boost your blood iron levels, which may be affected by heavy menstrual bleeding.  Pay close attention to your period and tell your health care provider about any changes, such as:  Increased blood flow that requires you to use more pads or tampons than usual per month.  A change in the number of days that your period lasts per month.  A change in symptoms that are associated with your period, such as abdominal cramping or back pain. SEEK MEDICAL CARE IF:  You have pelvic pain, back pain, or abdominal cramps that cannot be controlled with medicines.  You have an increase in bleeding between and during periods.  You soak tampons or pads in a half hour or less.  You feel lightheaded, extra tired, or weak. SEEK IMMEDIATE MEDICAL CARE IF:  You faint.  You have a sudden increase in pelvic pain.   This information is not intended to replace advice given to you by your health care provider. Make sure you discuss any questions you have with your health care provider.   Document Released: 06/23/2000 Document Revised: 07/17/2014 Document Reviewed: 12/23/2013 Elsevier Interactive Patient Education 2016 Elsevier Inc.  

## 2016-04-19 NOTE — Telephone Encounter (Signed)
Dr Jaynee Eagles- FYI  Called pt back. Made f/u on 05/31/16 at 11:30am, check in 11:15am.  Advised Dr Jaynee Eagles is booked out until November.  Advised we will place on wait list in case there is a cx and she can come sooner. She verbalized understanding. Advised her to call PCP if she has further questions prior to appt.

## 2016-04-19 NOTE — Telephone Encounter (Signed)
Per DPR access note I left message asking patient to call me to discuss scheduling surgery and ins benefits/estimated prepayment amount.

## 2016-04-19 NOTE — Telephone Encounter (Signed)
Pt called said she was at ED @ [facility] on 10/8 and today. She was dx with migraines and told she needs to see her neurologist and needs a MRI. Dr A is booked into November. Can you get her in sooner please

## 2016-04-20 NOTE — Telephone Encounter (Signed)
I spoke with patient about ins benefits for surgery.  She cannot afford this financially at this time and will need to wait. She will call me when she is ready.

## 2016-05-01 ENCOUNTER — Encounter (HOSPITAL_COMMUNITY): Payer: Self-pay | Admitting: Emergency Medicine

## 2016-05-01 ENCOUNTER — Emergency Department (HOSPITAL_COMMUNITY)
Admission: EM | Admit: 2016-05-01 | Discharge: 2016-05-01 | Disposition: A | Discharge status: Home / Self Care | Payer: 59 | Attending: Emergency Medicine | Admitting: Emergency Medicine

## 2016-05-01 DIAGNOSIS — R55 Syncope and collapse: Secondary | ICD-10-CM | POA: Diagnosis not present

## 2016-05-01 DIAGNOSIS — Z7982 Long term (current) use of aspirin: Secondary | ICD-10-CM | POA: Diagnosis not present

## 2016-05-01 DIAGNOSIS — Z87891 Personal history of nicotine dependence: Secondary | ICD-10-CM | POA: Insufficient documentation

## 2016-05-01 DIAGNOSIS — Z79899 Other long term (current) drug therapy: Secondary | ICD-10-CM | POA: Diagnosis not present

## 2016-05-01 DIAGNOSIS — R569 Unspecified convulsions: Secondary | ICD-10-CM | POA: Diagnosis present

## 2016-05-01 LAB — CBC
HCT: 38.2 % (ref 36.0–46.0)
HEMOGLOBIN: 13.2 g/dL (ref 12.0–15.0)
MCH: 31.6 pg (ref 26.0–34.0)
MCHC: 34.6 g/dL (ref 30.0–36.0)
MCV: 91.4 fL (ref 78.0–100.0)
Platelets: 236 10*3/uL (ref 150–400)
RBC: 4.18 MIL/uL (ref 3.87–5.11)
RDW: 13.2 % (ref 11.5–15.5)
WBC: 6.3 10*3/uL (ref 4.0–10.5)

## 2016-05-01 LAB — BASIC METABOLIC PANEL
ANION GAP: 6 (ref 5–15)
BUN: 9 mg/dL (ref 6–20)
CHLORIDE: 112 mmol/L — AB (ref 101–111)
CO2: 21 mmol/L — AB (ref 22–32)
Calcium: 8.6 mg/dL — ABNORMAL LOW (ref 8.9–10.3)
Creatinine, Ser: 0.45 mg/dL (ref 0.44–1.00)
GFR calc non Af Amer: >60 mL/min (ref >60)
Glucose, Bld: 99 mg/dL (ref 65–99)
Potassium: 3.4 mmol/L — ABNORMAL LOW (ref 3.5–5.1)
SODIUM: 139 mmol/L (ref 135–145)

## 2016-05-01 LAB — DIFFERENTIAL
Basophils Absolute: 0 10*3/uL (ref 0.0–0.1)
Basophils Relative: 0 %
EOS PCT: 2 %
Eosinophils Absolute: 0.1 10*3/uL (ref 0.0–0.7)
LYMPHS ABS: 2.1 10*3/uL (ref 0.7–4.0)
LYMPHS PCT: 32 %
Monocytes Absolute: 0.3 10*3/uL (ref 0.1–1.0)
Monocytes Relative: 5 %
NEUTROS ABS: 4.1 10*3/uL (ref 1.7–7.7)
NEUTROS PCT: 61 %

## 2016-05-01 LAB — URINALYSIS, ROUTINE W REFLEX MICROSCOPIC
Bilirubin Urine: NEGATIVE
Glucose, UA: NEGATIVE mg/dL
Hgb urine dipstick: NEGATIVE
Ketones, ur: 15 mg/dL — AB
Leukocytes, UA: NEGATIVE
Nitrite: NEGATIVE
Protein, ur: NEGATIVE mg/dL
Specific Gravity, Urine: 1.011 (ref 1.005–1.030)
pH: 7 (ref 5.0–8.0)

## 2016-05-01 LAB — CBG MONITORING, ED: Glucose-Capillary: 92 mg/dL (ref 65–99)

## 2016-05-01 MED ORDER — DIPHENHYDRAMINE HCL 50 MG/ML IJ SOLN
25.0000 mg | Freq: Once | INTRAMUSCULAR | Status: AC
  Administered 2016-05-01: 25 mg via INTRAVENOUS
  Filled 2016-05-01: qty 1

## 2016-05-01 MED ORDER — KETOROLAC TROMETHAMINE 15 MG/ML IJ SOLN
15.0000 mg | Freq: Once | INTRAMUSCULAR | Status: AC
  Administered 2016-05-01: 15 mg via INTRAVENOUS
  Filled 2016-05-01: qty 1

## 2016-05-01 MED ORDER — METOCLOPRAMIDE HCL 5 MG/ML IJ SOLN
5.0000 mg | Freq: Once | INTRAMUSCULAR | Status: AC
  Administered 2016-05-01: 5 mg via INTRAVENOUS
  Filled 2016-05-01: qty 2
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
## 2016-05-01 NOTE — ED Notes (Signed)
IV being d/c from left hand. 20G clean dry and intact.

## 2016-05-01 NOTE — ED Notes (Signed)
Nurse is in the room attempting to collect labs 

## 2016-05-01 NOTE — ED Notes (Signed)
Pt escorted via wheelchair to exit with family, moving all extremities well. Pt discharged by previous RN.

## 2016-05-01 NOTE — ED Notes (Signed)
Bed: [id number] Expected date:  Expected time:  Means of arrival:  Comments: EMS 45 yo F, seizure

## 2016-05-01 NOTE — Discharge Instructions (Signed)
Please contact your primary care provider and neurologist to inform them of today's visit and all relevant data. Please schedule immediate follow-up evaluation. Please return the emergency room if you have any new or worsening signs or symptoms.

## 2016-05-01 NOTE — ED Triage Notes (Signed)
Per EMS, pt was found on the floor by coworkers and pt's hands were twitching.  Pt has a history of seizures a long time ago but has not had one in a long time.  Pt seems very tired, lethargic, hypertensive, and tachycardic. Pt has a 20G in left hand.   Hx of depression, anxiety.  BP: 160/118 HR: 120 CBG: 108 100% RA

## 2016-05-02 ENCOUNTER — Telehealth: Payer: Self-pay | Admitting: Neurology

## 2016-05-02 NOTE — Telephone Encounter (Signed)
Called and made earlier f/u per pt request to eval migraines/syncope. Ok to use 12pm slot per Dr Jaynee Eagles on 05/05/16.  Advised pt to check in by 1130am, no later since we close at 12pm. She verbalized understanding.

## 2016-05-02 NOTE — Telephone Encounter (Signed)
Pt called to advise she went to [facility] ED yesterday and was dx with syncope. She is wanting to be seen sooner than 05/31/16.

## 2016-05-05 ENCOUNTER — Encounter: Payer: Self-pay | Admitting: *Deleted

## 2016-05-05 ENCOUNTER — Ambulatory Visit (INDEPENDENT_AMBULATORY_CARE_PROVIDER_SITE_OTHER): Payer: 59 | Admitting: Neurology

## 2016-05-05 ENCOUNTER — Encounter: Payer: Self-pay | Admitting: Neurology

## 2016-05-05 VITALS — BP 112/65 | HR 79

## 2016-05-05 DIAGNOSIS — R404 Transient alteration of awareness: Secondary | ICD-10-CM | POA: Diagnosis not present

## 2016-05-05 DIAGNOSIS — F445 Conversion disorder with seizures or convulsions: Secondary | ICD-10-CM

## 2016-05-05 MED ORDER — PROCHLORPERAZINE MALEATE 10 MG PO TABS: 10.0000 mg | ORAL_TABLET | Freq: Three times a day (TID) | ORAL | 6 refills | Status: DC | PRN
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
## 2016-05-05 NOTE — Patient Instructions (Signed)
Remember to drink plenty of fluid, eat healthy meals and do not skip any meals. Try to eat protein with a every meal and eat a healthy snack such as fruit or nuts in between meals. Try to keep a regular sleep-wake schedule and try to exercise daily, particularly in the form of walking, 20-30 minutes a day, if you can.   As far as your medications are concerned, I would like to suggest: compazine for nausea or dizziness  As far as diagnostic testing: EEG then 3-day eeg  I would like to see you back in 4 months, sooner if we need to. Please call us with any interim questions, concerns, problems, updates or refill requests.   Our phone number is [phone]. We also have an after hours call service for urgent matters and there is a physician on-call for urgent questions. For any emergencies you know to call 911 or go to the nearest emergency room  Prochlorperazine tablets What is this medicine? PROCHLORPERAZINE (proe klor PER a zeen) helps to control severe nausea and vomiting. This medicine is also used to treat schizophrenia. It can also help patients who experience anxiety that is not due to psychological illness. This medicine may be used for other purposes; ask your health care provider or pharmacist if you have questions. What should I tell my health care provider before I take this medicine? They need to know if you have any of these conditions: -blood disorders or disease -dementia -liver disease or jaundice -Parkinson's disease -uncontrollable movement disorder -an unusual or allergic reaction to prochlorperazine, other medicines, foods, dyes, or preservatives -pregnant or trying to get pregnant -breast-feeding How should I use this medicine? Take this medicine by mouth with a glass of water. Follow the directions on the prescription label. Take your doses at regular intervals. Do not take your medicine more often than directed. Do not stop taking this medicine suddenly. This can  cause nausea, vomiting, and dizziness. Ask your doctor or health care professional for advice. Talk to your pediatrician regarding the use of this medicine in children. Special care may be needed. While this drug may be prescribed for children as young as 2 years for selected conditions, precautions do apply. Overdosage: If you think you have taken too much of this medicine contact a poison control center or emergency room at once. NOTE: This medicine is only for you. Do not share this medicine with others. What if I miss a dose? If you miss a dose, take it as soon as you can. If it is almost time for your next dose, take only that dose. Do not take double or extra doses. What may interact with this medicine? Do not take this medicine with any of the following medications: -amoxapine -antidepressants like citalopram, escitalopram, fluoxetine, paroxetine, and sertraline -deferoxamine -dofetilide -maprotiline -tricyclic antidepressants like amitriptyline, clomipramine, imipramine, nortiptyline and others This medicine may also interact with the following medications: -lithium -medicines for pain -phenytoin -propranolol -warfarin This list may not describe all possible interactions. Give your health care provider a list of all the medicines, herbs, non-prescription drugs, or dietary supplements you use. Also tell them if you smoke, drink alcohol, or use illegal drugs. Some items may interact with your medicine. What should I watch for while using this medicine? Visit your doctor or health care professional for regular checks on your progress. You may get drowsy or dizzy. Do not drive, use machinery, or do anything that needs mental alertness until you know how this medicine affects  you. Do not stand or sit up quickly, especially if you are an older patient. This reduces the risk of dizzy or fainting spells. Alcohol may interfere with the effect of this medicine. Avoid alcoholic drinks. This  medicine can reduce the response of your body to heat or cold. Dress warm in cold weather and stay hydrated in hot weather. If possible, avoid extreme temperatures like saunas, hot tubs, very hot or cold showers, or activities that can cause dehydration such as vigorous exercise. This medicine can make you more sensitive to the sun. Keep out of the sun. If you cannot avoid being in the sun, wear protective clothing and use sunscreen. Do not use sun lamps or tanning beds/booths. Your mouth may get dry. Chewing sugarless gum or sucking hard candy, and drinking plenty of water may help. Contact your doctor if the problem does not go away or is severe. What side effects may I notice from receiving this medicine? Side effects that you should report to your doctor or health care professional as soon as possible: -blurred vision -breast enlargement in men or women -breast milk in women who are not breast-feeding -chest pain, fast or irregular heartbeat -confusion, restlessness -dark yellow or brown urine -difficulty breathing or swallowing -dizziness or fainting spells -drooling, shaking, movement difficulty (shuffling walk) or rigidity -fever, chills, sore throat -involuntary or uncontrollable movements of the eyes, mouth, head, arms, and legs -seizures -stomach area pain -unusually weak or tired -unusual bleeding or bruising -yellowing of skin or eyes Side effects that usually do not require medical attention (report to your doctor or health care professional if they continue or are bothersome): -difficulty passing urine -difficulty sleeping -headache -sexual dysfunction -skin rash, or itching This list may not describe all possible side effects. Call your doctor for medical advice about side effects. You may report side effects to FDA at 1-800-FDA-1088. Where should I keep my medicine? Keep out of the reach of children. Store at room temperature between 15 and 30 degrees C (59 and 86  degrees F). Protect from light. Throw away any unused medicine after the expiration date. NOTE: This sheet is a summary. It may not cover all possible information. If you have questions about this medicine, talk to your doctor, pharmacist, or health care provider.    2016, [employer]vier/[employer] Standard. (2011-11-14 16:59:39)

## 2016-05-07 DIAGNOSIS — R404 Transient alteration of awareness: Secondary | ICD-10-CM | POA: Insufficient documentation

## 2016-05-08 ENCOUNTER — Telehealth: Payer: Self-pay | Admitting: Neurology

## 2016-05-08 NOTE — Telephone Encounter (Signed)
Dr Jaynee Eagles- please advise. Patient scheduled for EEG 05/17/16.

## 2016-05-08 NOTE — Telephone Encounter (Signed)
Pt called in asking if she can be written out of work until results of EEG. She is requesting a letter stating she is to be out of work from 10/23 until results of EEG , tbd by physician. Her work is willing to let her work from home for the 6 months she is not allowed to drive. This letter will help her company as well , to have time to get her a laptop. Please call and advise [phone]

## 2016-05-08 NOTE — Telephone Encounter (Signed)
Sorry, there is no indication for her to be out of work until the EEG. I apologize, she can work at this point so I can;t write her out.

## 2016-05-08 NOTE — Telephone Encounter (Signed)
Called and spoke to pt. Advised per Dr Jaynee Eagles she cannot write letter at this time. No indication for her to be out of work until EEG. She can work at this point per Dr Jaynee Eagles. Pt verbalized understanding.

## 2016-05-12 ENCOUNTER — Emergency Department (HOSPITAL_COMMUNITY)
Admission: EM | Admit: 2016-05-12 | Discharge: 2016-05-12 | Disposition: A | Discharge status: Home / Self Care | Payer: 59 | Attending: Emergency Medicine | Admitting: Emergency Medicine

## 2016-05-12 ENCOUNTER — Encounter (HOSPITAL_COMMUNITY): Payer: Self-pay | Admitting: *Deleted

## 2016-05-12 DIAGNOSIS — R55 Syncope and collapse: Secondary | ICD-10-CM | POA: Insufficient documentation

## 2016-05-12 DIAGNOSIS — Z7982 Long term (current) use of aspirin: Secondary | ICD-10-CM | POA: Insufficient documentation

## 2016-05-12 DIAGNOSIS — Z87891 Personal history of nicotine dependence: Secondary | ICD-10-CM | POA: Insufficient documentation

## 2016-05-12 HISTORY — DX: Unspecified convulsions: R56.9

## 2016-05-12 LAB — CBG MONITORING, ED: Glucose-Capillary: 88 mg/dL (ref 65–99)

## 2016-05-12 NOTE — ED Notes (Signed)
Seizure pads are in place. 

## 2016-05-12 NOTE — ED Triage Notes (Signed)
Per EMS, pt transported from her work, had a seizure episode.  Pt is A&Ox 4, pt reports hx of seizure.  Per pt's co-worker, pt slid off of her chair, onto the floor then she came to A&Ox 4.

## 2016-05-12 NOTE — ED Provider Notes (Signed)
Jacqueline Davis   CSN: [id number] Arrival date & time: 05/12/16  1158     History   Chief Complaint Chief Complaint  Patient presents with  . Seizures    HPI Jacqueline Davis is a 45 y.o. female.  45 yo F with a chief complaint of loss of consciousness. This happened while she was at work. She states she was talking on the phone suddenly felt like she was given a pass out and then did. She has had multiple episodes like this in the past that are thought to be seizures. She was down for less than a couple seconds and then was immediately back with it. Denies loss of bowel or bladder. This was not exertional episode. She denies head injury denies fever.   The history is provided by the patient.  Seizures   This is a recurrent problem. The current episode started less than 1 hour ago. The problem has not changed since onset.There was 1 seizure. The most recent episode lasted less than 30 seconds. Pertinent negatives include no headaches, no visual disturbance, no chest pain, no nausea and no vomiting. The episode was witnessed. There was no sensation of an aura present. The seizures did not continue in the ED. The seizure(s) had no focality. There has been no fever.    Past Medical History:  Diagnosis Date  . Depression   . Endometriosis   . Fibroid tumor    Uterus  . High cholesterol   . Migraine   . Seizures ([place])   . Smoker     Patient Active Problem List   Diagnosis Date Noted  . Transient alteration of awareness 05/07/2016  . Left leg weakness 10/02/2015  . Migraine 08/17/2015  . Diplopia 08/17/2015  . Imbalance 08/17/2015  . Tremor 08/17/2015  . Personal history of DVT (deep vein thrombosis) 07/20/2015  . Fibroid, uterine 07/20/2015    Past Surgical History:  Procedure Laterality Date  . CESAREAN SECTION  1990/1999   X2  . CHOLECYSTECTOMY, LAPAROSCOPIC  2016  . FOOT SURGERY  2005/2006   X2  . OOPHORECTOMY Right   . TUBAL LIGATION       OB History    Gravida Para Term Preterm AB Living   2 2       2    SAB TAB Ectopic Multiple Live Births                   Home Medications    Prior to Admission medications   Medication Sig Start Date End Date Taking? Authorizing Provider  aspirin EC 81 MG tablet Take 81 mg by mouth daily.   Yes Historical Provider, MD  butalbital-acetaminophen-caffeine (FIORICET, ESGIC) 50-325-40 MG tablet Take 1 tablet by mouth 2 (two) times daily as needed for headache. Less than 2 times daily   Yes Historical Provider, MD  Butalbital-APAP-Caffeine (FIORICET) 50-300-40 MG CAPS Take 1 capsule by mouth 2 (two) times daily as needed (for headaches).   Yes Historical Provider, MD  calcium carbonate (TUMS - DOSED IN MG ELEMENTAL CALCIUM) 500 MG chewable tablet Chew 1 tablet by mouth 2 (two) times daily as needed for indigestion or heartburn.   Yes Historical Provider, MD  calcium-vitamin D 250-100 MG-UNIT tablet Take 1 tablet by mouth daily.    Yes Historical Provider, MD  cholecalciferol (VITAMIN D) 1000 units tablet Take 1,000 Units by mouth daily.   Yes Historical Provider, MD  clonazePAM (KLONOPIN) 0.5 MG tablet Take 0.5 mg by mouth 2 (  two) times daily as needed for anxiety.    Yes Historical Provider, MD  FLUoxetine (PROZAC) 40 MG capsule Take 40 mg by mouth daily.   Yes Historical Provider, MD  gabapentin (NEURONTIN) 100 MG capsule Take 100 mg by mouth 2 (two) times daily.    Yes Historical Provider, MD  prochlorperazine (COMPAZINE) 10 MG tablet Take 1 tablet (10 mg total) by mouth 3 (three) times daily as needed for nausea or vomiting. Or for dizziness. 05/05/16  Yes Melvenia Beam, MD  ranitidine (ZANTAC) 150 MG tablet Take 150 mg by mouth 4 (four) times daily.    Yes Historical Provider, MD  topiramate (TOPAMAX) 100 MG tablet Take 100 mg by mouth at bedtime.   Yes Historical Provider, MD  topiramate (TOPAMAX) 25 MG tablet Take 75 mg by mouth at bedtime.   Yes Historical Provider, MD   medroxyPROGESTERone (PROVERA) 10 MG tablet Take 1 tablet (10 mg total) by mouth daily. Patient not taking: Reported on 05/12/2016 09/07/11 09/06/12  Denita Lung, MD    Family History Family History  Problem Relation Age of Onset  . Diabetes Mother   . Hypertension Mother   . Fibromyalgia Mother   . Cancer Sister     UTERINE  . Diabetes Paternal Grandfather   . Endometriosis Daughter   . Endometriosis Sister   . Neuropathy Neg Hx     Social History Social History  Substance Use Topics  . Smoking status: Former Smoker    Packs/day: 1.00    Types: Cigarettes    Quit date: 07/19/2009  . Smokeless tobacco: Never Used  . Alcohol use 0.0 oz/week     Comment: OCC     Allergies   Quetiapine fumarate   Review of Systems Review of Systems  Constitutional: Negative for chills and fever.  HENT: Negative for congestion and rhinorrhea.   Eyes: Negative for redness and visual disturbance.  Respiratory: Negative for shortness of breath and wheezing.   Cardiovascular: Negative for chest pain and palpitations.  Gastrointestinal: Negative for nausea and vomiting.  Genitourinary: Negative for dysuria and urgency.  Musculoskeletal: Negative for arthralgias and myalgias.  Skin: Negative for pallor and wound.  Neurological: Positive for seizures and syncope. Negative for dizziness and headaches.     Physical Exam Updated Vital Signs BP 108/69   Pulse 85   Temp 98.9 F (37.2 C)   Resp 16   Ht 5\' 3"  (1.6 m)   Wt 208 lb (94.3 kg)   LMP 05/01/2016   SpO2 97%   BMI 36.85 kg/m   Physical Exam  Constitutional: She is oriented to person, place, and time. She appears well-developed and well-nourished. No distress.  HENT:  Head: Normocephalic and atraumatic.  Eyes: EOM are normal. Pupils are equal, round, and reactive to light.  Neck: Normal range of motion. Neck supple.  Cardiovascular: Normal rate and regular rhythm.  Exam reveals no gallop and no friction rub.   No murmur  heard. Pulmonary/Chest: Effort normal. She has no wheezes. She has no rales.  Abdominal: Soft. She exhibits no distension. There is no tenderness.  Musculoskeletal: She exhibits no edema or tenderness.  Neurological: She is alert and oriented to person, place, and time.  Skin: Skin is warm and dry. She is not diaphoretic.  Psychiatric: She has a normal mood and affect. Her behavior is normal.  Nursing Davis and vitals reviewed.    ED Treatments / Results  Labs (all labs ordered are listed, but only abnormal results are  displayed) Labs Reviewed  CBG MONITORING, ED    EKG  EKG Interpretation  Date/Time:  Friday May 12 2016 14:08:27 EDT Ventricular Rate:  75 PR Interval:    QRS Duration: 109 QT Interval:  403 QTC Calculation: 451 R Axis:   36 Text Interpretation:  Sinus rhythm Probable anteroseptal infarct, old no wpw, prolonged qt or brugada No old tracing to compare Confirmed by Sydni Elizarraraz MD, DANIEL [phone]) on 05/12/2016 2:19:41 PM       Radiology No results found.  Procedures Procedures (including critical care time)  Medications Ordered in ED Medications - No data to display   Initial Impression / Assessment and Plan / ED Course  I have reviewed the triage vital signs and the nursing notes.  Pertinent labs & imaging results that were available during my care of the patient were reviewed by me and considered in my medical decision making (see chart for details).  Clinical Course    45 yo F With a chief complaint of loss of consciousness. Her history sounds more consistent with syncope then seizures. EKG with no significant findings. I will have her follow with cardiology as well as neurology.  2:25 PM:  I have discussed the diagnosis/risks/treatment options with the patient and family and believe the pt to be eligible for discharge home to follow-up with Cards, neuro. We also discussed returning to the ED immediately if new or worsening sx occur. We discussed the  sx which are most concerning (e.g., sudden worsening pain, fever, inability to tolerate by mouth) that necessitate immediate return. Medications administered to the patient during their visit and any new prescriptions provided to the patient are listed below.  Medications given during this visit Medications - No data to display   The patient appears reasonably screen and/or stabilized for discharge and I doubt any other medical condition or other [facility] requiring further screening, evaluation, or treatment in the ED at this time prior to discharge.    Final Clinical Impressions(s) / ED Diagnoses   Final diagnoses:  Syncope and collapse    New Prescriptions New Prescriptions   No medications on file     Deno Etienne, DO 05/12/16 1425

## 2016-05-12 NOTE — ED Notes (Signed)
Bed: WA18 Expected date:  Expected time:  Means of arrival:  Comments: EMS-[facility] 

## 2016-05-12 NOTE — Discharge Instructions (Signed)
Call the cardiologist to get a appointment for likely long time ecg monitor and ultrasound of your heart.

## 2016-05-15 ENCOUNTER — Telehealth: Payer: Self-pay | Admitting: Neurology

## 2016-05-15 ENCOUNTER — Encounter: Payer: Self-pay | Admitting: *Deleted

## 2016-05-15 NOTE — Telephone Encounter (Signed)
I can write patient out of work or to stay home until the workup(office eeg and then 3-day eeg) is completed. If she had another episode I will need to increase her Topamax, please ask her if this is ok with her thanks

## 2016-05-15 NOTE — Telephone Encounter (Signed)
Faxed completed form to [facility] to schedule pt for in-home 72-hour EEG. Fax: [phone]. Received confirmation.   Called and spoke to pt. She asked letter be faxed to [phone], Nanda Quinton. Advised I am faxing tonight and for her to call back tomorrow if she does not receive it. She verbalized understanding. I received fax confirmation for this.

## 2016-05-15 NOTE — Telephone Encounter (Signed)
Patient called to request that Dr. Jaynee Eagles write a letter that she needs to work from home, job is such, she can work from home, states she had another episode at work Friday, resulting in transport by EMS to hospital, "I busted both sides of my head, my knees" states every time this happens at work, it results in a ride to the hospital by EMS, if she works from home, her mother is there. Please call [phone].

## 2016-05-15 NOTE — Telephone Encounter (Signed)
Asked patient not to call the office multiple times a day unless it is an emergency, I am with patients all day and we get back within 24 hours. She had another episode. She is still increasing the Topamax will get to 100mg  on Wednesday. EEG is Novemebr 8th, and then if needed we will set uo a 3-day eeg (emma please forward the order to them today thanks). I would like them to have the eeg scheduled with the next week.   Emma, please write that patient needs to have neurologic workup over the next 4 weeks and she should be out of work or given the option to work from home.

## 2016-05-15 NOTE — Telephone Encounter (Signed)
Called patient back. Relayed Dr Cathren Laine message below. She verbalized understanding. She is wondering when can she be written out of work until. She is scheduled for EEG in office on 05/17/16. Not yet scheduled for 3-day EEG.   She is agreeable to increase her topamax. She is currently taking 75mg  at bedtime. She is going up to 100mg  at bedtime on Wednesday.  She is wondering what she would like her to increase it to.  Advised I will discuss with AA, MD and call her back to let her know. She verbalized understanding.

## 2016-05-15 NOTE — Telephone Encounter (Signed)
Dr Jaynee Eagles- please advise. Pt called again

## 2016-05-15 NOTE — Telephone Encounter (Signed)
Dr Jaynee Eagles- please advise. Pt called previously asking for work letter. I had advised her we could not at that time per your note on 05/08/16. How would you like to proceed?

## 2016-05-15 NOTE — Telephone Encounter (Signed)
Patient called back about work note and increase in topiramate (TOPAMAX) 25 MG tablet, please call [phone].

## 2016-05-16 NOTE — Telephone Encounter (Signed)
Tiffany/ [facility] called in requesting more clinical notes on the pt. Please call [phone]- , fax [phone]

## 2016-05-16 NOTE — Telephone Encounter (Signed)
Jacqueline Davis called office back. I advised pt having routine EEG in office tomorrow. She asked results of that and office notes be faxed once we receive those results. Advised Dr Jaynee Eagles wants her scheduled for 3day EEG in home within the next week or two if possible. She verbalized understanding.

## 2016-05-16 NOTE — Telephone Encounter (Signed)
LVM returning call from [place]. Asked her to call back to discuss. Gave [facility] phone number.

## 2016-05-17 ENCOUNTER — Ambulatory Visit (INDEPENDENT_AMBULATORY_CARE_PROVIDER_SITE_OTHER): Payer: 59

## 2016-05-17 DIAGNOSIS — R404 Transient alteration of awareness: Secondary | ICD-10-CM

## 2016-05-17 DIAGNOSIS — R55 Syncope and collapse: Secondary | ICD-10-CM

## 2016-05-17 NOTE — Procedures (Signed)
    History:  Jacqueline Davis is a 45 year old patient with a history of bipolar disorder. The patient has had some problem with tremors and falls. She has had 3 episodes of double vision in the last year. She has recently had an episode of syncope with 3 recent emergency room visits. The patient is being evaluated for possible seizures.  This is a routine EEG. No skull defects are noted. Medications include aspirin, Fioricet, calcium carbonate, vitamin D supplementation, clonazepam, Prozac, Flonase, gabapentin, Robaxin, multivitamins, Prilosec, Zantac, Topamax, and Provera.   EEG classification: Normal awake  Description of the recording: The background rhythms of this recording consists of a fairly well modulated medium amplitude alpha rhythm of 9 Hz that is reactive to eye opening and closure. As the record progresses, the patient appears to remain in the waking state throughout the recording. Photic stimulation was performed, resulting in a bilateral and symmetric photic driving response. Hyperventilation was also performed, resulting in a minimal buildup of the background rhythm activities without significant slowing seen. At no time during the recording does there appear to be evidence of spike or spike wave discharges or evidence of focal slowing. EKG monitor shows no evidence of cardiac rhythm abnormalities with a heart rate of 60.  Impression: This is a normal EEG recording in the waking state. No evidence of ictal or interictal discharges are seen.

## 2016-05-17 NOTE — Telephone Encounter (Signed)
Faxed EEG routine results with last OV note to [employer] as requested. Received fax confirmation.

## 2016-05-18 ENCOUNTER — Telehealth: Payer: Self-pay | Admitting: *Deleted

## 2016-05-18 ENCOUNTER — Telehealth: Payer: Self-pay | Admitting: Neurology

## 2016-05-18 NOTE — Telephone Encounter (Signed)
Called and spoke to pt. Relayed Dr Cathren Laine message below. She stated she is feeling much better. Her sister who is a Marine scientist came over and assessed her. After talking with her, she had not eaten or drank anything today before taking all her medication. She has since eaten and is feeling better.   AA,MD messaged [facility] who stated they have left patient a couple messages to call and schedule. I relayed this to patient. She stated she has no missed calls or VM's. I gave her number [phone] to call and schedule. I advised her to try and call right now and schedule as soon as possible.  She verbalized understanding.

## 2016-05-18 NOTE — Telephone Encounter (Signed)
-----   Message from Melvenia Beam, MD sent at 05/17/2016  5:27 PM EST ----- EEG is normal thanks

## 2016-05-18 NOTE — Telephone Encounter (Signed)
Spoke to patient. Offered her EEG from the 13-16th. She declined due to vacation on the 16th. I let patient know I find it very unusual that she says she is having seizures yet she is declining an EEG to go on vacation. I do think these may be non-epileptic events or malingering. We will start the ambulatory eeg on the 13th and disconnect on the 15th evening. If negative she can go back to work and I will refer her for emu admission if we have not captured an event.

## 2016-05-18 NOTE — Telephone Encounter (Signed)
Called and spoke to pt about EEG results per AA,MD. She verbalized understanding. Advised she should be getting a call soon to schedule 3-day EEG from [facility]. If she does not hear anything in the next week, advised her to call and let me know.

## 2016-05-18 NOTE — Telephone Encounter (Signed)
Dr Jaynee Eagles- please advise. She was just seen on 10/27 and had her routine EEG which was normal. I did fax over results and your office note to [facility]. Have not heard about appt yet for her 3 day EEG. Still waiting on this.

## 2016-05-18 NOTE — Telephone Encounter (Signed)
Pt called in stating she has taken all her daily medications. She took a nap and upon waking she said she has been extremely dizzy. She says her eyes are moving back and forth , left to right. When she closes her eyes she feels better but it is making her sick on her stomach. She is currently in her bathroom on the floor trying not to be sick. Please call and advise

## 2016-05-18 NOTE — Telephone Encounter (Signed)
She needs to schedule her 3-day eeg as soon as they can go there, they are going to call. Her symptoms are not seizures, she needs to to go to the emergency room we can;t see her acutely today. thanks

## 2016-05-31 ENCOUNTER — Ambulatory Visit: Payer: Self-pay | Admitting: Neurology

## 2016-06-06 NOTE — Telephone Encounter (Signed)
Dr Jaynee Eagles- I put results from [facility] for you to review in folder with your notes from today. Please advise. I can call patient once you review.

## 2016-06-06 NOTE — Telephone Encounter (Signed)
3-day eeg was normal, the events she had were not seizures. These are called non-epileptic events and they are not related to abnormal activity in the brain. There is nothing more we can do for her in neurology since they are not seizures. She needs to follow with pcp for other causes such as cardiac causes or she needs to follow with psychiatry to evaluate for stress related non-epileptic events which are actually very common.

## 2016-06-06 NOTE — Telephone Encounter (Signed)
Called and spoke to pt. Relayed AA,MD message below. She verbalized understanding and will f/u with both PCP and psychiatry. She asked I send copy of report to PCP. Advised I will send that today for their records. She verbalized understanding.   Faxed copy to PCP. Fax: [phone]. Received confirmation.

## 2016-06-06 NOTE — Telephone Encounter (Signed)
Pt called wanting to know if inhome EEG results were in. She said she had a seizure 11/14 and another 11/23. pl

## 2016-06-07 ENCOUNTER — Other Ambulatory Visit: Payer: Self-pay | Admitting: Neurology

## 2016-06-07 DIAGNOSIS — F445 Conversion disorder with seizures or convulsions: Secondary | ICD-10-CM | POA: Insufficient documentation

## 2016-06-07 DIAGNOSIS — R569 Unspecified convulsions: Secondary | ICD-10-CM | POA: Insufficient documentation

## 2016-06-15 ENCOUNTER — Ambulatory Visit (INDEPENDENT_AMBULATORY_CARE_PROVIDER_SITE_OTHER): Payer: 59 | Admitting: Cardiology

## 2016-06-15 ENCOUNTER — Encounter: Payer: Self-pay | Admitting: Cardiology

## 2016-06-15 ENCOUNTER — Encounter (INDEPENDENT_AMBULATORY_CARE_PROVIDER_SITE_OTHER): Payer: 59

## 2016-06-15 ENCOUNTER — Telehealth: Payer: Self-pay | Admitting: Cardiology

## 2016-06-15 VITALS — BP 116/77 | HR 64 | Ht 63.0 in | Wt 209.0 lb

## 2016-06-15 DIAGNOSIS — R55 Syncope and collapse: Secondary | ICD-10-CM

## 2016-06-15 DIAGNOSIS — R569 Unspecified convulsions: Secondary | ICD-10-CM

## 2016-06-15 DIAGNOSIS — G43109 Migraine with aura, not intractable, without status migrainosus: Secondary | ICD-10-CM

## 2016-06-15 DIAGNOSIS — F445 Conversion disorder with seizures or convulsions: Secondary | ICD-10-CM

## 2016-06-15 NOTE — Telephone Encounter (Signed)
Mrs. Dinallo is calling to find out if she is to go to work  since she is unable to drive . She is currently under short disability at work . Please call

## 2016-06-15 NOTE — Progress Notes (Signed)
PCP: Annetta Maw, MD  Clinic Note: Chief Complaint  Patient presents with  . New Patient (Initial Visit)    pt states no chest pain but some face and left arm tingling some nausea some dizziness headaches sometimes feels as if she isn't breathing enough. also states she is here due to passing out a lot always cold     HPI: Jacqueline Davis is a 45 y.o. female with a PMH below who presents today for Cardiology evaluation following an ER visit for syncope back in November.  Jacqueline Davis was last seen in the [facility] ([place] ER back on November 11. She apparently was at a birthday event on the evening before. She went to sleep. She then was at band practice and was running lights & noticed that the lights were bothering her some. The next thing she remembered the EMTs where her bedside she felt sore and exhausted and with a headache. Apparently 2 days for that she also had a seizure and hit her head with loss of consciousness. Apparently the seizure lasted about 3 minutes. Was not apparently on medications.  She had an EEG done by neurology on November 8 -- noted having 3 episodes of double vision the past year when an episode of syncope with 3 emergency room visits notable; EEG was read as normal.  Initial ER visit, was on November 3 when she had a loss of consciousness episode. She was barely talking on the phone and had a brief sensation of that she would follow up and passed out. Susceptible to several occasions. She is only on first couple seconds. No loss of bowel or bladder concern. 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  Recent Hospitalizations: No admissions. ER visits noted above  Studies Reviewed: None  Interval History: Presents to figure out the cause of her syncopal episodes. She states that she's had about 12-15 episodes similar to this in the past 6 months. The first one occurred when she was packing up from  camping back in October. She felt as though she couldn't breathe maybe thought her heart going fast and then this passed out. Most episodes occur like this that she feels dizzy and she can't catch her breath. There is no sensation of a vertigo-like spinning. These episodes occur whether she is lying down or sitting up. She is actually had 2 episodes while lying down. Other ones tend to be when she is actually up walking around doing something but nothing progressive. She describes a sensation of numbness in her face as if she may be hyperventilating. She's not sure hyperventilating precedes her initiation of symptoms or comes as a part of it later on. She's not really sure she feels like her heart rate is going fast until she knows what happened and then she feels anxious about it. She denies having dizziness was rapid standing up, but will notice it somewhat bending over. She also notes that she drinks a lot of water and uses extra salt  Otherwise she denies any chest tightness or pressure either dyspnea with rest or exertion. She tends to relatively active. She has lost about 100 pounds last 2 years by exercise and diet and has been very conscious of her diet, she drinks a lot of water as a way to really lose weight.  No PND, orthopnea or edema. No real symptoms to suggest TIA or amaurosis fugax. Only a couple of the episodes that she's had a  she actually truly passed out, but has had several where she almost passed out.  Interestingly, her symptoms all began when her Topamax was started for headaches.  ROS: A comprehensive was performed. Review of Systems  Constitutional: Positive for weight loss (Over 100 pounds in the past 2 years). Negative for chills, fever and malaise/fatigue.  HENT: Negative.   Respiratory: Negative.   Cardiovascular:       Per history of present illness  Gastrointestinal: Negative for blood in stool, constipation, diarrhea and melena.  Genitourinary: Negative.     Musculoskeletal: Negative.   Skin: Negative.   Neurological: Positive for dizziness (Per history of present illness) and loss of consciousness (Per history of present illness). Negative for seizures (She has been told by neurology that an episode she had while on the EEG monitor was not a seizure.).  Psychiatric/Behavioral: The patient is nervous/anxious (Because she is scared of these symptoms).     Past Medical History:  Diagnosis Date  . Depression   . Endometriosis   . Fibroid tumor    Uterus  . High cholesterol   . Migraine   . Seizures ([place])   . Smoker     Past Surgical History:  Procedure Laterality Date  . CESAREAN SECTION  1990/1999   X2  . CHOLECYSTECTOMY, LAPAROSCOPIC  2016  . FOOT SURGERY  2005/2006   X2  . OOPHORECTOMY Right   . TUBAL LIGATION      Current Meds  Medication Sig  . aspirin EC 81 MG tablet Take 81 mg by mouth daily.  . butalbital-acetaminophen-caffeine (FIORICET, ESGIC) 50-325-40 MG tablet Take 1 tablet by mouth 2 (two) times daily as needed for headache. Less than 2 times daily  . calcium carbonate (TUMS - DOSED IN MG ELEMENTAL CALCIUM) 500 MG chewable tablet Chew 1 tablet by mouth 2 (two) times daily as needed for indigestion or heartburn.  . calcium-vitamin D 250-100 MG-UNIT tablet Take 1 tablet by mouth daily.   . cholecalciferol (VITAMIN D) 1000 units tablet Take 1,000 Units by mouth daily.  . clonazePAM (KLONOPIN) 0.5 MG tablet Take 0.5 mg by mouth 2 (two) times daily as needed for anxiety.   Marland Kitchen FLUoxetine (PROZAC) 40 MG capsule Take 40 mg by mouth daily.  Marland Kitchen gabapentin (NEURONTIN) 100 MG capsule Take 100 mg by mouth 2 (two) times daily.   . ranitidine (ZANTAC) 150 MG tablet Take 150 mg by mouth 4 (four) times daily.   Marland Kitchen topiramate (TOPAMAX) 100 MG tablet Take 100 mg by mouth at bedtime.    Allergies  Allergen Reactions  . Quetiapine Fumarate Other (See Comments)    "made me really angry like and crabby"    Social History   Social  History  . Marital status: Married    Spouse name: Jeral Fruit  . Number of children: 2  . Years of education: 14   Occupational History  . [employer]    Social History Main Topics  . Smoking status: Former Smoker    Packs/day: 1.00    Types: Cigarettes    Quit date: 07/19/2009  . Smokeless tobacco: Never Used  . Alcohol use 0.0 oz/week     Comment: OCC  . Drug use: No  . Sexual activity: Yes     Comment: TUBAL LIGATION    Other Topics Concern  . None   Social History Narrative   Lives with husband and daughter   Caffeine use: 1/2-1 cup coffee per day    family history includes Cancer in  her sister; Diabetes in her mother and paternal grandfather; Endometriosis in her daughter and sister; Fibromyalgia in her mother; Hypertension in her mother.  Wt Readings from Last 3 Encounters:  06/15/16 94.8 kg (209 lb)  05/12/16 94.3 kg (208 lb)  05/01/16 95.7 kg (211 lb)    PHYSICAL EXAM BP 116/77   Pulse 64   Ht 5\' 3"  (1.6 m)   Wt 94.8 kg (209 lb)   BMI 37.02 kg/m  General appearance: alert, cooperative, appears stated age, no distress and Moderately obese. Well groomed HEENT: [place]/AT, EOMI, MMM, anicteric sclera Neck: no adenopathy, no carotid bruit and no JVD Lungs: clear to auscultation bilaterally, normal percussion bilaterally and non-labored Heart: regular rate and rhythm, S1& S2 normal, no murmur, click, rub or gallop -- there is an extra heart sound and I had a very difficult time to distinguishing whether it was a gallop or split S2. Abdomen: soft, non-tender; bowel sounds normal; no masses,  no organomegaly; obese Extremities: extremities normal, atraumatic, no cyanosis, and edema Pulses: 2+ and symmetric;  Skin: mobility and turgor normal, no evidence of bleeding or bruising and no lesions noted Neurologic: Mental status: Alert, oriented, thought content appropriate Cranial nerves: normal (II-XII grossly intact)    Adult ECG Report Not checked  Other studies  Reviewed: Additional studies/ records that were reviewed today include:  Recent Labs:   Lab Results  Component Value Date   CREATININE 0.45 05/01/2016   BUN 9 05/01/2016   NA 139 05/01/2016   K 3.4 (L) 05/01/2016   CL 112 (H) 05/01/2016   CO2 21 (L) 05/01/2016    ASSESSMENT / PLAN: Problem List Items Addressed This Visit    Migraine    Apparently her migraine treatment have been adjusted prior to the onset of these episodes and she was started on Topamax. Not sure if this would have been due with her onset of symptoms as I am not aware of side effects of Topamax. -- It is not listed as a known side effect.      Pseudoseizures    If not a true syncopal event, pseudoseizures would be another potential etiology, however would defer this to neurology.      Syncope - Primary    It's hard to tell what these episodes truly are. They indeed do sound like they would be more consistent with a seizure. Unfortunately she had an episode while wearing EEG monitors but not telemetry monitor so we don't have what her rhythm was doing. They're happening frequently enough therapy may be to capture an episode with a 30 day monitor. We needed to determine if there is an arrhythmia associated with it. The fact that she has the tightness in her chest and inability to breathe would suspect that maybe she is having a tachycardia.  We'll also check 2-D echocardiogram as I heard an extra heart sound and I'm concerned for the the dyspnea and potential tachycardia associated with the syncope.  Neither of these 2 productive Data that would be conclusive, we would then consider a tilt table test. - It is possible that with her dramatic weight loss, some of her autonomic nervous system has gone awry.      Relevant Orders   ECHOCARDIOGRAM COMPLETE (Completed)   Cardiac event monitor   VAS US CAROTID      Current medicines are reviewed at length with the patient today. (+/- concerns) none The following  changes have been made: none  Patient Instructions  SCHEDULE [place]  [place] [address] Your physician has requested that you have an echocardiogram. Echocardiography is a painless test that uses sound waves to create images of your heart. It provides your doctor with information about the size and shape of your heart and how well your heart's chambers and valves are working. This procedure takes approximately one hour. There are no restrictions for this procedure.   SCHEDULE [place] has requested that you have a carotid duplex , CHECKING VERTEBRALS. This test is an ultrasound of the carotid arteries in your neck. It looks at blood flow through these arteries that supply the brain with blood. Allow one hour for this exam. There are no restrictions or special instructions.   [place] Your physician has recommended that you wear an event monitor 30 DAY MONITOR. Event monitors are medical devices that record the heart's electrical activity. Doctors most often [place] these monitors to diagnose arrhythmias. Arrhythmias are problems with the speed or rhythm of the heartbeat. The monitor is a small, portable device. You can wear one while you do your normal daily activities. This is usually used to diagnose what is causing palpitations/syncope (passing out).   Your physician recommends that you schedule a follow-up appointment in2 MONTHS WITH DR Skylah Delauter .- 30 MIN   F/U RESULTS    Studies Ordered:   Orders Placed This Encounter  Procedures  . Cardiac event monitor  . ECHOCARDIOGRAM COMPLETE      Glenetta Hew, M.D., M.S. Interventional Cardiologist   Pager # [phone] Phone # [phone] [address] [place], [place] [address]

## 2016-06-15 NOTE — Telephone Encounter (Signed)
Returned call to patient.She stated she saw Dr.Harding this morning.Stated he told her she could go back to work.Stated she still cannot drive and does not have a way to work.Stated she wanted to ask Dr.Harding if he will keep her out of work until she can drive.Message sent to [place].

## 2016-06-15 NOTE — Telephone Encounter (Signed)
I think is probably safe for her to stay out of work if she needs to have someone drive her until we have her completed her 30 day monitor.  The main concern is not working, it is getting to work.   Glenetta Hew, MD

## 2016-06-15 NOTE — Patient Instructions (Addendum)
SCHEDULE [place] has requested that you have an echocardiogram. Echocardiography is a painless test that uses sound waves to create images of your heart. It provides your doctor with information about the size and shape of your heart and how well your heart's chambers and valves are working. This procedure takes approximately one hour. There are no restrictions for this procedure.   SCHEDULE [place] has requested that you have a carotid duplex , CHECKING VERTEBRALS. This test is an ultrasound of the carotid arteries in your neck. It looks at blood flow through these arteries that supply the brain with blood. Allow one hour for this exam. There are no restrictions or special instructions.   [place] Your physician has recommended that you wear an event monitor 30 DAY MONITOR. Event monitors are medical devices that record the heart's electrical activity. Doctors most often [place] these monitors to diagnose arrhythmias. Arrhythmias are problems with the speed or rhythm of the heartbeat. The monitor is a small, portable device. You can wear one while you do your normal daily activities. This is usually used to diagnose what is causing palpitations/syncope (passing out).   Your physician recommends that you schedule a follow-up appointment in2 MONTHS WITH DR HARDING .- 30 MIN   F/U RESULTS

## 2016-06-16 ENCOUNTER — Other Ambulatory Visit: Payer: Self-pay

## 2016-06-16 ENCOUNTER — Ambulatory Visit (HOSPITAL_COMMUNITY): Payer: 59 | Attending: Internal Medicine

## 2016-06-16 DIAGNOSIS — I340 Nonrheumatic mitral (valve) insufficiency: Secondary | ICD-10-CM | POA: Insufficient documentation

## 2016-06-16 DIAGNOSIS — Z720 Tobacco use: Secondary | ICD-10-CM | POA: Diagnosis not present

## 2016-06-16 DIAGNOSIS — I517 Cardiomegaly: Secondary | ICD-10-CM | POA: Diagnosis not present

## 2016-06-16 DIAGNOSIS — R55 Syncope and collapse: Secondary | ICD-10-CM | POA: Insufficient documentation

## 2016-06-16 NOTE — Telephone Encounter (Signed)
Pt notified, pt states that she needs a note for work she will call back with fax number and who to send it to

## 2016-06-16 NOTE — Telephone Encounter (Signed)
Pt called back about the status of her returning to work being put in writing because of her Medications.  Her employer needs to know.  Please give her a call back.

## 2016-06-16 NOTE — Telephone Encounter (Signed)
Duplicate see below  Pt is calling with fax # for note for work  [phone] atten: Norm Salt

## 2016-06-16 NOTE — Telephone Encounter (Signed)
Letter sent to [facility] as requested

## 2016-06-17 ENCOUNTER — Encounter: Payer: Self-pay | Admitting: Cardiology

## 2016-06-17 DIAGNOSIS — R55 Syncope and collapse: Secondary | ICD-10-CM | POA: Insufficient documentation

## 2016-06-17 NOTE — Assessment & Plan Note (Signed)
If not a true syncopal event, pseudoseizures would be another potential etiology, however would defer this to neurology.

## 2016-06-17 NOTE — Assessment & Plan Note (Signed)
It's hard to tell what these episodes truly are. They indeed do sound like they would be more consistent with a seizure. Unfortunately she had an episode while wearing EEG monitors but not telemetry monitor so we don't have what her rhythm was doing. They're happening frequently enough therapy may be to capture an episode with a 30 day monitor. We needed to determine if there is an arrhythmia associated with it. The fact that she has the tightness in her chest and inability to breathe would suspect that maybe she is having a tachycardia.  We'll also check 2-D echocardiogram as I heard an extra heart sound and I'm concerned for the the dyspnea and potential tachycardia associated with the syncope.  Neither of these 2 productive Data that would be conclusive, we would then consider a tilt table test. - It is possible that with her dramatic weight loss, some of her autonomic nervous system has gone awry.

## 2016-06-17 NOTE — Assessment & Plan Note (Signed)
Apparently her migraine treatment have been adjusted prior to the onset of these episodes and she was started on Topamax. Not sure if this would have been due with her onset of symptoms as I am not aware of side effects of Topamax. -- It is not listed as a known side effect.

## 2016-07-07 ENCOUNTER — Other Ambulatory Visit (HOSPITAL_COMMUNITY): Payer: 59

## 2016-07-18 ENCOUNTER — Ambulatory Visit (HOSPITAL_COMMUNITY)
Admission: RE | Admit: 2016-07-18 | Discharge: 2016-07-18 | Disposition: A | Discharge status: Home / Self Care | Payer: 59 | Source: Ambulatory Visit | Attending: Cardiology | Admitting: Cardiology

## 2016-07-18 DIAGNOSIS — Z87891 Personal history of nicotine dependence: Secondary | ICD-10-CM | POA: Diagnosis not present

## 2016-07-18 DIAGNOSIS — R55 Syncope and collapse: Secondary | ICD-10-CM | POA: Diagnosis not present

## 2016-07-24 ENCOUNTER — Telehealth: Payer: Self-pay | Admitting: Cardiology

## 2016-07-24 NOTE — Telephone Encounter (Signed)
Ms. Schwarzkopf is calling because she would like results from her ultrasounds on her heart and carotid arteries. Also patient is out of work on short-term disability which ends this week, patient had a fainting spell this past Saturday. She would like to know what she needs to do? Please call, thanks.

## 2016-07-24 NOTE — Telephone Encounter (Signed)
Spoke with pt states that she had another syncopal event she did loose  For about 2 minutes and does have numbness and tingling in her her face and lips and left arm. She states that she did not go to there ER because they do not do anything there. Made appt to see Jacqueline Davis 07-25-2016 to discuss and review recent studies

## 2016-07-25 ENCOUNTER — Ambulatory Visit (INDEPENDENT_AMBULATORY_CARE_PROVIDER_SITE_OTHER): Payer: 59 | Admitting: Cardiology

## 2016-07-25 ENCOUNTER — Encounter: Payer: Self-pay | Admitting: Cardiology

## 2016-07-25 VITALS — BP 112/80 | HR 72 | Ht 62.0 in | Wt 212.6 lb

## 2016-07-25 DIAGNOSIS — R55 Syncope and collapse: Secondary | ICD-10-CM | POA: Diagnosis not present

## 2016-07-25 DIAGNOSIS — I479 Paroxysmal tachycardia, unspecified: Secondary | ICD-10-CM

## 2016-07-25 DIAGNOSIS — R079 Chest pain, unspecified: Secondary | ICD-10-CM | POA: Diagnosis not present

## 2016-07-25 MED ORDER — DILTIAZEM HCL ER COATED BEADS 120 MG PO CP24: 120.0000 mg | ORAL_CAPSULE | Freq: Every day | ORAL | 3 refills | Status: AC

## 2016-07-25 NOTE — Patient Instructions (Signed)
Medication Instructions:  START taking diltiazem 120mg  one time daily.  Labwork: NONE  Testing/Procedures: Your physician has requested that you have en exercise stress myoview at [facility]. For further information please visit [email or web address]. Please follow instruction sheet, as given. *Do not take diltiazem the day before or the day of test*  Follow-Up: Follow up in March, 2018  Any Other Special Instructions Will Be Listed Below (If Applicable).  Dr. Ellyn Hack approves you to return to work on January 29th, 2018.   If you need a refill on your cardiac medications before your next appointment, please call your pharmacy.

## 2016-07-25 NOTE — Progress Notes (Signed)
PCP: Annetta Maw, MD  Clinic Note: Chief Complaint  Patient presents with  . Follow-up    Syncope/near syncope  . Dizziness    constantly.     HPI: Jacqueline Davis is a 46 y.o. female with a PMH below who presents today for Follow-up Cardiology evaluation for syncope back in November. She has worn event monitor and an echocardiogram. She also carotid Dopplers. She was seen in the [facility] ER back on November 11. She apparently was at a birthday event on the evening before. She went to sleep. She then was at band practice and was running lights & noticed that the lights were bothering her some. The next thing she remembered the EMTs where her bedside she felt sore and exhausted and with a headache. Apparently 2 days for that she also had a seizure and hit her head with loss of consciousness. Apparently the seizure lasted about 3 minutes. Was not apparently on medications. Interestingly, her symptoms all began when her Topamax was started for headaches.   Jacqueline Davis was seen initially here on 06/15/2016. She apparently had 12-15 episodes similar to her true syncope episode in the past 6 months. They seem to be potentially related to an arrhythmia, however cannot be sure.  Recent Hospitalizations: No admissions. ER visits noted above  Studies Reviewed: None  Echo 06/2016: Essentially normal echo with EF of 60-65%. GR 2 DD. Moderate left atrial dilation. Otherwise normal.  Monitor: Mostly sinus rhythm with PACs and PVCs. 2 short bursts of 5-10 beats of what looked like PAT with aberrancy versus one of them being 5 beats of VT. (By her report, she did have a syncopal episode while wearing the monitor that was not documented as being associated with an arrhythmia.  Carotid Dopplers January 2018: normal  Interval History:  Jacqueline Davis presents today again still noticing these feelings of the fluttering sensations and at least one pass out episodes since I  last saw her. She says that occurred when she was wearing a monitor, but nothing more came out of it - she thinks she may have actually lost consciousness, but is not sure. She was simply just sitting in a chair at her friend's house listening to them play music. She just still feels fatigued and tired. Not sleeping well. She goes to bed and these now are just does not get good sleep. In addition to feeling or chest fluttering, she has noted some occasional episodes of discomfort in her chest that oftentimes with walking, but also can be with her palpitations.  No real exertional dyspnea, she just has some exercise fatigue. Has not yet been back to work since I saw her.  she drinks a lot of water as a way to really lose weight.  No PND, orthopnea or edema. No real symptoms to suggest TIA or amaurosis fugax. \   ROS: A comprehensive was performed. Review of Systems  Constitutional: Positive for weight loss (Over 100 pounds in the past 2 years). Negative for chills, fever and malaise/fatigue.  HENT: Negative.   Respiratory: Negative.  Negative for shortness of breath.   Cardiovascular:       Per history of present illness  Gastrointestinal: Negative for blood in stool, constipation, diarrhea and melena.  Genitourinary: Negative.   Musculoskeletal: Negative.  Negative for back pain, joint pain and myalgias.  Skin: Negative.   Neurological: Positive for dizziness (Per history of present illness) and loss of consciousness (Per history of present illness). Negative for  seizures (She has been told by neurology that an episode she had while on the EEG monitor was not a seizure.).  Psychiatric/Behavioral: The patient is nervous/anxious (Because she is scared of these symptoms).   All other systems reviewed and are negative.   Past Medical History:  Diagnosis Date  . Depression   . Endometriosis   . Fibroid tumor    Uterus  . High cholesterol   . Migraine   . Seizures ([place])   . Smoker      Past Surgical History:  Procedure Laterality Date  . CESAREAN SECTION  1990/1999   X2  . CHOLECYSTECTOMY, LAPAROSCOPIC  2016  . FOOT SURGERY  2005/2006   X2  . OOPHORECTOMY Right   . TUBAL LIGATION      Current Meds  Medication Sig  . aspirin EC 81 MG tablet Take 81 mg by mouth daily.  . butalbital-acetaminophen-caffeine (FIORICET, ESGIC) 50-325-40 MG tablet Take 1 tablet by mouth 2 (two) times daily as needed for headache. Less than 2 times daily  . calcium carbonate (TUMS - DOSED IN MG ELEMENTAL CALCIUM) 500 MG chewable tablet Chew 1 tablet by mouth 2 (two) times daily as needed for indigestion or heartburn.  . calcium-vitamin D 250-100 MG-UNIT tablet Take 1 tablet by mouth daily.   . cholecalciferol (VITAMIN D) 1000 units tablet Take 1,000 Units by mouth daily.  . clonazePAM (KLONOPIN) 0.5 MG tablet Take 0.5 mg by mouth 2 (two) times daily as needed for anxiety.   Marland Kitchen FLUoxetine (PROZAC) 40 MG capsule Take 40 mg by mouth daily.  Marland Kitchen gabapentin (NEURONTIN) 100 MG capsule Take 100 mg by mouth 2 (two) times daily.   . ranitidine (ZANTAC) 150 MG tablet Take 150 mg by mouth 4 (four) times daily.   Marland Kitchen topiramate (TOPAMAX) 100 MG tablet Take 100 mg by mouth at bedtime.    Allergies  Allergen Reactions  . Quetiapine Fumarate Other (See Comments)    "made me really angry like and crabby"    Social History   Social History  . Marital status: Married    Spouse name: Jeral Fruit  . Number of children: 2  . Years of education: 14   Occupational History  . [employer]    Social History Main Topics  . Smoking status: Former Smoker    Packs/day: 1.00    Types: Cigarettes    Quit date: 07/19/2009  . Smokeless tobacco: Never Used  . Alcohol use 0.0 oz/week     Comment: OCC  . Drug use: No  . Sexual activity: Yes     Comment: TUBAL LIGATION    Other Topics Concern  . None   Social History Narrative   Lives with husband and daughter   Caffeine use: 1/2-1 cup coffee per day     family history includes Cancer in her sister; Diabetes in her mother and paternal grandfather; Endometriosis in her daughter and sister; Fibromyalgia in her mother; Hypertension in her mother.  Wt Readings from Last 3 Encounters:  07/25/16 96.4 kg (212 lb 9.6 oz)  06/15/16 94.8 kg (209 lb)  05/12/16 94.3 kg (208 lb)    PHYSICAL EXAM BP 112/80   Pulse 72   Ht 5\' 2"  (1.575 m)   Wt 96.4 kg (212 lb 9.6 oz)   BMI 38.89 kg/m  General appearance: alert, cooperative, appears stated age, no distress and Moderately obese. Well groomed HEENT: [place]/AT, EOMI, MMM, anicteric sclera Neck: no adenopathy, no carotid bruit and no JVD Lungs: clear to  auscultation bilaterally, normal percussion bilaterally and non-labored Heart: regular rate and rhythm, S1& S2 normal, no murmur, click, rub or gallop -- there is an extra heart sound and I had a very difficult time to distinguishing whether it was a gallop or split S2. Abdomen: soft, non-tender; bowel sounds normal; no masses,  no organomegaly; obese Extremities: extremities normal, atraumatic, no cyanosis, and edema Pulses: 2+ and symmetric;  Skin: mobility and turgor normal, no evidence of bleeding or bruising and no lesions noted Neurologic: Mental status: Alert, oriented, thought content appropriate Cranial nerves: normal (II-XII grossly intact)    Adult ECG Report Not checked  Other studies Reviewed: Additional studies/ records that were reviewed today include:  Recent Labs:   Lab Results  Component Value Date   CREATININE 0.45 05/01/2016   BUN 9 05/01/2016   NA 139 05/01/2016   K 3.4 (L) 05/01/2016   CL 112 (H) 05/01/2016   CO2 21 (L) 05/01/2016    ASSESSMENT / PLAN: Problem List Items Addressed This Visit    Syncope - Primary (Chronic)    Perhaps even more complicating now. She did have some short bursts of PAT visit looked like wide complex/apparently conducted beats. These however were not long enough to lead to syncope. They  also were not associated with her episodes that she had while wearing a monitor. I'm still a bit perplexed, but I think we can probably rule out severe arrhythmias. Her echocardiogram was relatively normal.  I think she is probably okay going back to work once we did do a stress test on her. Would continue to follow an oval low threshold for loop recorder if symptoms continue to be perplexing. May need neurologic or EP evaluation.      Relevant Medications   diltiazem (CARDIZEM CD) 120 MG 24 hr capsule   Other Relevant Orders   Myocardial Perfusion Imaging   Chest pain with moderate risk for cardiac etiology    She didn't mention this last time I saw her, but she was talking about somebody symptoms back then and maybe forgotten. She has had some atypical rather typical sounding symptoms for possible angina. In light of her having some wide-complex tachycardia on monitor and frequent PVCs I would like to include a potential ischemic component. I would also like to see what her EKG does with exercise.  Plan: Treadmill Myoview.      Relevant Orders   Myocardial Perfusion Imaging   Paroxysmal tachycardia ([place])    She did have episodes of sinus tachycardia as well as things alone but potential PAT her PSVT.  I don't want to use a beta blocker because of her chronic fatigue, therefore I will start low-dose beta blocker:   Plan: Diltiazem 120 mg daily.      Relevant Medications   diltiazem (CARDIZEM CD) 120 MG 24 hr capsule   Other Relevant Orders   Myocardial Perfusion Imaging      Current medicines are reviewed at length with the patient today. (+/- concerns) none The following changes have been made: none  Patient Instructions  Medication Instructions:  START taking diltiazem 120mg  one time daily.  Labwork: NONE  Testing/Procedures: Your physician has requested that you have en exercise stress myoview at [facility]. For further information please visit  [email or web address]. Please follow instruction sheet, as given. *Do not take diltiazem the day before or the day of test*  Follow-Up: Follow up in March, 2018  Any Other Special Instructions Will Be Listed Below (If Applicable).  Dr.  Ellyn Hack approves you to return to work on January 29th, 2018.   If you need a refill on your cardiac medications before your next appointment, please call your pharmacy.    Studies Ordered:   Orders Placed This Encounter  Procedures  . Myocardial Perfusion Imaging      Glenetta Hew, M.D., M.S. Interventional Cardiologist   Pager # [phone] Phone # [phone] [address] [place], [place] [address]

## 2016-07-27 ENCOUNTER — Encounter: Payer: Self-pay | Admitting: Cardiology

## 2016-07-27 DIAGNOSIS — I479 Paroxysmal tachycardia, unspecified: Secondary | ICD-10-CM | POA: Insufficient documentation

## 2016-07-27 DIAGNOSIS — R079 Chest pain, unspecified: Secondary | ICD-10-CM | POA: Insufficient documentation

## 2016-07-27 NOTE — Assessment & Plan Note (Signed)
She didn't mention this last time I saw her, but she was talking about somebody symptoms back then and maybe forgotten. She has had some atypical rather typical sounding symptoms for possible angina. In light of her having some wide-complex tachycardia on monitor and frequent PVCs I would like to include a potential ischemic component. I would also like to see what her EKG does with exercise.  Plan: Treadmill Myoview.

## 2016-07-27 NOTE — Assessment & Plan Note (Signed)
She did have episodes of sinus tachycardia as well as things alone but potential PAT her PSVT.  I don't want to use a beta blocker because of her chronic fatigue, therefore I will start low-dose beta blocker:   Plan: Diltiazem 120 mg daily.

## 2016-07-27 NOTE — Assessment & Plan Note (Signed)
Perhaps even more complicating now. She did have some short bursts of PAT visit looked like wide complex/apparently conducted beats. These however were not long enough to lead to syncope. They also were not associated with her episodes that she had while wearing a monitor. I'm still a bit perplexed, but I think we can probably rule out severe arrhythmias. Her echocardiogram was relatively normal.  I think she is probably okay going back to work once we did do a stress test on her. Would continue to follow an oval low threshold for loop recorder if symptoms continue to be perplexing. May need neurologic or EP evaluation.

## 2016-07-28 ENCOUNTER — Telehealth (HOSPITAL_COMMUNITY): Payer: Self-pay

## 2016-07-28 NOTE — Telephone Encounter (Signed)
Encounter complete. 

## 2016-08-02 ENCOUNTER — Ambulatory Visit (HOSPITAL_COMMUNITY)
Admission: RE | Admit: 2016-08-02 | Discharge: 2016-08-02 | Disposition: A | Discharge status: Home / Self Care | Payer: 59 | Source: Ambulatory Visit | Attending: Cardiovascular Disease | Admitting: Cardiovascular Disease

## 2016-08-02 DIAGNOSIS — R55 Syncope and collapse: Secondary | ICD-10-CM | POA: Diagnosis not present

## 2016-08-02 DIAGNOSIS — R079 Chest pain, unspecified: Secondary | ICD-10-CM | POA: Insufficient documentation

## 2016-08-02 DIAGNOSIS — I479 Paroxysmal tachycardia, unspecified: Secondary | ICD-10-CM | POA: Diagnosis not present

## 2016-08-02 LAB — MYOCARDIAL PERFUSION IMAGING
CHL CUP MPHR: 175 {beats}/min
CHL CUP NUCLEAR SDS: 1
CHL RATE OF PERCEIVED EXERTION: 17
CSEPED: 7 min
Estimated workload: 9.9 METS
Exercise duration (sec): 55 s
LVDIAVOL: 122 mL (ref 46–106)
LVSYSVOL: 53 mL
NUC STRESS TID: 1.19
Peak HR: 171 {beats}/min
Percent HR: 97 %
Rest HR: 61 {beats}/min
SRS: 0
SSS: 1

## 2016-08-02 MED ORDER — TECHNETIUM TC 99M TETROFOSMIN IV KIT
31.2000 | PACK | Freq: Once | INTRAVENOUS | Status: AC | PRN
  Administered 2016-08-02: 31.2 via INTRAVENOUS
  Filled 2016-08-02: qty 32

## 2016-08-02 MED ORDER — TECHNETIUM TC 99M TETROFOSMIN IV KIT
10.5000 | PACK | Freq: Once | INTRAVENOUS | Status: AC | PRN
  Administered 2016-08-02: 10.5 via INTRAVENOUS
  Filled 2016-08-02: qty 11

## 2016-08-03 NOTE — Progress Notes (Signed)
Stress Test looked good!! No sign of significant Heart Artery Disease.  Pump function is normal.  Good news!!.  Leonie Man, MD  Pls forward to PCP: Dr. Annetta Maw

## 2016-08-04 ENCOUNTER — Telehealth: Payer: Self-pay | Admitting: Cardiology

## 2016-08-04 NOTE — Telephone Encounter (Signed)
Spoke to patient to communicate findings of her recent stress test. She voiced understanding of results. States she is concerned by the fact that nothing was found, as she's still having concerns for her symptoms. Notes she's followed up with neurology, no established explanation of her symptoms on their end, such as seizures. She feels like she's "back to square one". She indicates she's been trying to investigate MS as a cause for her myriad of symptoms. Informed her I would let Dr. Ellyn Hack know of her concerns, if he had any further suggestions other than to f/u w PCP, I would communicate these w her.

## 2016-08-04 NOTE — Telephone Encounter (Signed)
I really don't know what to say. Hurst test of all looked relatively normal. I am very leery of thinking that we need to consider a cardiac catheterization because of his invasive study and would not really explain her episodes. Her chest discomfort is most likely not cardiac at her age she is low risk for having a false negative test.  Up until NOW, WE REALLY HAVEN'T HAD A FEVER CARDIAC EVALUATION TO GIVE A UNIFYING DIAGNOSIS.  I did start her on diltiazem, and hopefully that will help her symptoms. But for now I would have to defer to neurology or her PCP.  Glenetta Hew, MD

## 2016-08-04 NOTE — Telephone Encounter (Signed)
New Message  Pt call requesting to speak with RN to get the results of her stress test. Please call back to discuss

## 2016-08-07 NOTE — Telephone Encounter (Signed)
Patient aware of recommendations.  

## 2016-08-18 ENCOUNTER — Ambulatory Visit: Payer: 59 | Admitting: Cardiology

## 2016-09-05 ENCOUNTER — Ambulatory Visit: Payer: 59 | Admitting: Adult Health

## 2016-09-18 ENCOUNTER — Ambulatory Visit: Payer: 59 | Admitting: Cardiology

## 2016-11-22 ENCOUNTER — Encounter: Payer: Self-pay | Admitting: Gynecology

## 2016-12-19 ENCOUNTER — Encounter (HOSPITAL_COMMUNITY): Payer: Self-pay | Admitting: *Deleted

## 2016-12-19 ENCOUNTER — Ambulatory Visit (HOSPITAL_COMMUNITY)
Admission: EM | Admit: 2016-12-19 | Discharge: 2016-12-19 | Disposition: A | Discharge status: Home / Self Care | Payer: 59 | Attending: Internal Medicine | Admitting: Internal Medicine

## 2016-12-19 DIAGNOSIS — R1310 Dysphagia, unspecified: Secondary | ICD-10-CM | POA: Diagnosis not present

## 2016-12-19 NOTE — ED Provider Notes (Signed)
CSN: [id number]     Arrival date & time 12/19/16  1425 History   First MD Initiated Contact with Patient 12/19/16 1457     Chief Complaint  Patient presents with  . Dysphagia   (Consider location/radiation/quality/duration/timing/severity/associated sxs/prior Treatment) 46 year old female presents to clinic with a chief complaint of dysphagia, this is been ongoing for approximately 4 years, she is been seen by ear nose and throat, and GI, been treated for acid reflux, and has had a scope done as well. She presents today looking for further answers with regard to her condition. There are no acute changes.   The history is provided by the patient.    Past Medical History:  Diagnosis Date  . Depression   . Endometriosis   . Fibroid tumor    Uterus  . High cholesterol   . Migraine   . Seizures ([place])   . Smoker    Past Surgical History:  Procedure Laterality Date  . CESAREAN SECTION  1990/1999   X2  . CHOLECYSTECTOMY, LAPAROSCOPIC  2016  . FOOT SURGERY  2005/2006   X2  . OOPHORECTOMY Right   . TUBAL LIGATION     Family History  Problem Relation Age of Onset  . Diabetes Mother   . Hypertension Mother   . Fibromyalgia Mother   . Cancer Sister        UTERINE  . Diabetes Paternal Grandfather   . Endometriosis Daughter   . Endometriosis Sister   . Neuropathy Neg Hx    Social History  Substance Use Topics  . Smoking status: Former Smoker    Packs/day: 1.00    Types: Cigarettes    Quit date: 07/19/2009  . Smokeless tobacco: Never Used  . Alcohol use 0.0 oz/week     Comment: OCC   OB History    Gravida Para Term Preterm AB Living   2 2       2    SAB TAB Ectopic Multiple Live Births                 Review of Systems  Constitutional: Negative.   HENT: Positive for trouble swallowing and voice change. Negative for congestion, sinus pain and sinus pressure.   Respiratory: Negative.   Cardiovascular: Negative.   Gastrointestinal: Negative.   Musculoskeletal:  Negative.   Skin: Negative.   Neurological: Negative.     Allergies  Quetiapine fumarate  Home Medications   Prior to Admission medications   Medication Sig Start Date End Date Taking? Authorizing Provider  aspirin EC 81 MG tablet Take 81 mg by mouth daily.    [provider]  butalbital-acetaminophen-caffeine (FIORICET, ESGIC) 50-325-40 MG tablet Take 1 tablet by mouth 2 (two) times daily as needed for headache. Less than 2 times daily    [provider]  calcium carbonate (TUMS - DOSED IN MG ELEMENTAL CALCIUM) 500 MG chewable tablet Chew 1 tablet by mouth 2 (two) times daily as needed for indigestion or heartburn.    [provider]  calcium-vitamin D 250-100 MG-UNIT tablet Take 1 tablet by mouth daily.     [provider]  cholecalciferol (VITAMIN D) 1000 units tablet Take 1,000 Units by mouth daily.    [provider]  clonazePAM (KLONOPIN) 0.5 MG tablet Take 0.5 mg by mouth 2 (two) times daily as needed for anxiety.     [provider]  diltiazem (CARDIZEM CD) 120 MG 24 hr capsule Take 1 capsule (120 mg total) by mouth daily. 07/25/16 10/23/16  Leonie Man, MD  FLUoxetine (PROZAC) 40 MG capsule Take 40 mg by mouth daily.    [provider]  gabapentin (NEURONTIN) 100 MG capsule Take 100 mg by mouth 2 (two) times daily.     [provider]  ranitidine (ZANTAC) 150 MG tablet Take 150 mg by mouth 4 (four) times daily.     [provider]  topiramate (TOPAMAX) 100 MG tablet Take 100 mg by mouth at bedtime.    [provider]   Meds Ordered and Administered this Visit  Medications - No data to display  BP 132/88 (BP Location: Right Arm)   Pulse 82   Temp 98.6 F (37 C) (Oral)   Resp 18   LMP 11/28/2016 (Within Days)   SpO2 100%  No data found.   Physical Exam  Constitutional: She is oriented to person, place, and time. She appears well-developed and well-nourished. No distress.  HENT:   Head: Normocephalic.  Right Ear: External ear normal.  Left Ear: External ear normal.  Nose: Nose normal.  Mouth/Throat: Oropharynx is clear and moist.  Eyes: Conjunctivae are normal.  Neck: Normal range of motion. Neck supple. No JVD present.  Cardiovascular: Normal rate and regular rhythm.   Pulmonary/Chest: Effort normal and breath sounds normal.  Lymphadenopathy:    She has no cervical adenopathy.  Neurological: She is alert and oriented to person, place, and time.  Skin: Skin is warm and dry. Capillary refill takes less than 2 seconds. She is not diaphoretic.  Psychiatric: She has a normal mood and affect. Her behavior is normal.  Nursing note and vitals reviewed.   Urgent Care Course     Procedures (including critical care time)  Labs Review Labs Reviewed - No data to display  Imaging Review No results found.    MDM   1. Dysphagia, unspecified type    Referred back to primary care, she may need further evaluation by GI, or ear nose and throat, as this is a chronic condition that has failed prior treatment plans.    Barnet Glasgow, NP 12/19/16 1534

## 2016-12-19 NOTE — ED Triage Notes (Signed)
Pt  Reports   Has  Had   Difficulty  Swallowing   For  Several   Weeks    Got    Worse  Today       Also  Reports   Soreness  To  r   Side  Of  Neck        And  Stiffness    That  Has  Gotten  Worse  Over  Last  Several  Months       Pt   Voice   Sounds  Hoarse      As  Well

## 2016-12-19 NOTE — Discharge Instructions (Signed)
For your symptoms, I recommend you follow-up with an ear nose and throat doctor, or a gastroenterologist, or your primary care provider, as we do not have the ability here to fully evaluate your conditions. If symptoms persist or fail to resolve, consider the emergency room.

## 2017-01-17 ENCOUNTER — Other Ambulatory Visit: Payer: Self-pay | Admitting: Adult Health

## 2017-01-17 DIAGNOSIS — Z86718 Personal history of other venous thrombosis and embolism: Secondary | ICD-10-CM

## 2017-03-22 ENCOUNTER — Encounter: Payer: Self-pay | Admitting: Adult Health

## 2018-01-26 IMAGING — NM NM MISC PROCEDURE
6 series · 36 of 36 positions shown · non-contrast
Comparison: none

[Series 1: wbr rest · 6.40mm/px · 6 of 61 frames shown]
[frame 6/61  full-range]
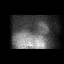
[frame 16/61  full-range]
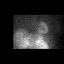
[frame 26/61  full-range]
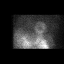
[frame 36/61  full-range]
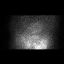
[frame 46/61  full-range]
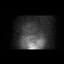
[frame 56/61  full-range]
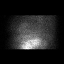

[Series 1: wbr_r-proj_st wbr rest · 6.40mm/px · 6 of 64 frames shown]
[frame 6/64]
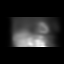
[frame 16/64]
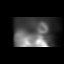
[frame 27/64]
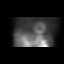
[frame 38/64]
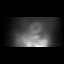
[frame 48/64]
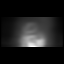
[frame 59/64]
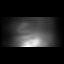

[Series 2: wbr_s-proj_st wbr stress-gsp · 6.40mm/px · 6 of 512 frames shown]
[frame 43/512]
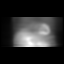
[frame 128/512]
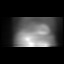
[frame 214/512]
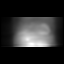
[frame 299/512]
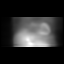
[frame 384/512]
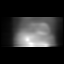
[frame 470/512]
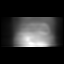

[Series 2: wbr stress-gsp · 6.40mm/px · 6 of 494 frames shown]
[frame 42/494  full-range]
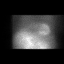
[frame 124/494  full-range]
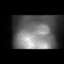
[frame 206/494  full-range]
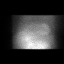
[frame 289/494  full-range]
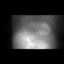
[frame 371/494  full-range]
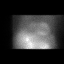
[frame 453/494  full-range]
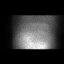

[Series 3: wbr stress-sum-em · 6.40mm/px · 6 of 64 frames shown]
[frame 6/64]
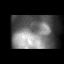
[frame 16/64]
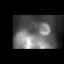
[frame 27/64]
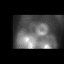
[frame 38/64]
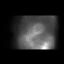
[frame 48/64]
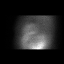
[frame 59/64]
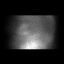

[Series 3: wbr_s-proj_st wbr stress-sum-em · 6.40mm/px · 6 of 64 frames shown]
[frame 6/64]
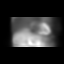
[frame 16/64]
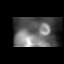
[frame 27/64]
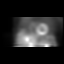
[frame 38/64]
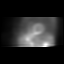
[frame 48/64]
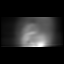
[frame 59/64]
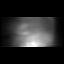

[36 of 36 positions shown; findings below may reference images not displayed]

Canned report from images found in remote index.

Refer to host system for actual result text.

## 2019-09-08 ENCOUNTER — Other Ambulatory Visit (HOSPITAL_BASED_OUTPATIENT_CLINIC_OR_DEPARTMENT_OTHER): Payer: Self-pay | Admitting: Family Medicine

## 2019-09-08 DIAGNOSIS — Z1231 Encounter for screening mammogram for malignant neoplasm of breast: Secondary | ICD-10-CM

## 2019-09-19 ENCOUNTER — Encounter (HOSPITAL_BASED_OUTPATIENT_CLINIC_OR_DEPARTMENT_OTHER): Payer: Self-pay

## 2019-09-19 ENCOUNTER — Other Ambulatory Visit: Payer: Self-pay

## 2019-09-19 ENCOUNTER — Ambulatory Visit (HOSPITAL_BASED_OUTPATIENT_CLINIC_OR_DEPARTMENT_OTHER)
Admission: RE | Admit: 2019-09-19 | Discharge: 2019-09-19 | Disposition: A | Discharge status: Home / Self Care | Payer: Managed Care, Other (non HMO) | Source: Ambulatory Visit | Attending: Family Medicine | Admitting: Family Medicine

## 2019-09-19 DIAGNOSIS — Z1231 Encounter for screening mammogram for malignant neoplasm of breast: Secondary | ICD-10-CM | POA: Insufficient documentation

## 2019-10-01 ENCOUNTER — Other Ambulatory Visit: Payer: Self-pay | Admitting: Family Medicine

## 2019-10-01 DIAGNOSIS — N6310 Unspecified lump in the right breast, unspecified quadrant: Secondary | ICD-10-CM

## 2019-10-08 ENCOUNTER — Other Ambulatory Visit: Payer: Self-pay

## 2019-10-08 ENCOUNTER — Ambulatory Visit
Admission: RE | Admit: 2019-10-08 | Discharge: 2019-10-08 | Disposition: A | Discharge status: Home / Self Care | Payer: Managed Care, Other (non HMO) | Source: Ambulatory Visit | Attending: Family Medicine | Admitting: Family Medicine

## 2019-10-08 DIAGNOSIS — N6310 Unspecified lump in the right breast, unspecified quadrant: Secondary | ICD-10-CM
